# Patient Record
Sex: Male | Born: 1970 | Race: Black or African American | Hispanic: No | Marital: Married | Smoking: Never smoker
Health system: Southern US, Community
[De-identification: ages and names within clinical notes are randomized; demographics above are authoritative.]

## PROBLEM LIST (undated history)

## (undated) DIAGNOSIS — I10 Essential (primary) hypertension: Secondary | ICD-10-CM

## (undated) DIAGNOSIS — K219 Gastro-esophageal reflux disease without esophagitis: Secondary | ICD-10-CM

## (undated) HISTORY — DX: Gastro-esophageal reflux disease without esophagitis: K21.9

---

## 1998-02-15 ENCOUNTER — Other Ambulatory Visit: Admission: RE | Admit: 1998-02-15 | Discharge: 1998-02-15 | Payer: Self-pay | Admitting: Orthopaedic Surgery

## 1999-03-07 ENCOUNTER — Ambulatory Visit (HOSPITAL_COMMUNITY): Admission: RE | Admit: 1999-03-07 | Discharge: 1999-03-07 | Payer: Self-pay | Admitting: Internal Medicine

## 1999-03-07 ENCOUNTER — Encounter: Payer: Self-pay | Admitting: Internal Medicine

## 1999-08-04 ENCOUNTER — Encounter: Payer: Self-pay | Admitting: Orthopaedic Surgery

## 1999-08-04 ENCOUNTER — Encounter: Admission: RE | Admit: 1999-08-04 | Discharge: 1999-08-04 | Payer: Self-pay | Admitting: Orthopaedic Surgery

## 1999-08-25 ENCOUNTER — Encounter: Admission: RE | Admit: 1999-08-25 | Discharge: 1999-08-25 | Payer: Self-pay | Admitting: Orthopaedic Surgery

## 1999-08-25 ENCOUNTER — Encounter: Payer: Self-pay | Admitting: Orthopaedic Surgery

## 2004-07-22 ENCOUNTER — Ambulatory Visit: Payer: Self-pay | Admitting: Internal Medicine

## 2004-08-29 ENCOUNTER — Ambulatory Visit: Payer: Self-pay | Admitting: Internal Medicine

## 2004-11-04 ENCOUNTER — Ambulatory Visit: Payer: Self-pay | Admitting: Internal Medicine

## 2005-03-06 ENCOUNTER — Ambulatory Visit: Payer: Self-pay | Admitting: Internal Medicine

## 2005-03-27 ENCOUNTER — Ambulatory Visit: Payer: Self-pay | Admitting: Internal Medicine

## 2006-02-23 ENCOUNTER — Ambulatory Visit: Payer: Self-pay | Admitting: Internal Medicine

## 2006-03-09 ENCOUNTER — Ambulatory Visit: Payer: Self-pay | Admitting: Internal Medicine

## 2006-06-21 ENCOUNTER — Ambulatory Visit: Payer: Self-pay | Admitting: Family Medicine

## 2006-06-21 ENCOUNTER — Ambulatory Visit: Payer: Self-pay | Admitting: *Deleted

## 2006-09-21 ENCOUNTER — Ambulatory Visit: Payer: Self-pay | Admitting: Internal Medicine

## 2006-11-22 ENCOUNTER — Ambulatory Visit: Payer: Self-pay | Admitting: Internal Medicine

## 2006-11-26 ENCOUNTER — Ambulatory Visit: Payer: Self-pay | Admitting: Family Medicine

## 2007-01-03 ENCOUNTER — Ambulatory Visit: Payer: Self-pay | Admitting: Internal Medicine

## 2007-07-03 ENCOUNTER — Encounter (INDEPENDENT_AMBULATORY_CARE_PROVIDER_SITE_OTHER): Payer: Self-pay | Admitting: *Deleted

## 2007-09-27 ENCOUNTER — Ambulatory Visit: Payer: Self-pay | Admitting: Nurse Practitioner

## 2007-09-27 DIAGNOSIS — I10 Essential (primary) hypertension: Secondary | ICD-10-CM | POA: Insufficient documentation

## 2007-09-27 DIAGNOSIS — J309 Allergic rhinitis, unspecified: Secondary | ICD-10-CM

## 2007-09-27 DIAGNOSIS — K219 Gastro-esophageal reflux disease without esophagitis: Secondary | ICD-10-CM

## 2007-09-27 DIAGNOSIS — N400 Enlarged prostate without lower urinary tract symptoms: Secondary | ICD-10-CM | POA: Insufficient documentation

## 2007-09-27 HISTORY — DX: Gastro-esophageal reflux disease without esophagitis: K21.9

## 2007-09-27 HISTORY — DX: Allergic rhinitis, unspecified: J30.9

## 2007-09-27 LAB — CONVERTED CEMR LAB
ALT: 13 units/L (ref 0–53)
AST: 15 units/L (ref 0–37)
Albumin: 4.8 g/dL (ref 3.5–5.2)
Alkaline Phosphatase: 83 units/L (ref 39–117)
BUN: 10 mg/dL (ref 6–23)
Basophils Absolute: 0.1 10*3/uL (ref 0.0–0.1)
Basophils Relative: 1 % (ref 0–1)
Bilirubin Urine: NEGATIVE
Blood in Urine, dipstick: NEGATIVE
CO2: 26 meq/L (ref 19–32)
Calcium: 9.5 mg/dL (ref 8.4–10.5)
Chloride: 102 meq/L (ref 96–112)
Creatinine, Ser: 0.88 mg/dL (ref 0.40–1.50)
Eosinophils Absolute: 0.2 10*3/uL (ref 0.2–0.7)
Eosinophils Relative: 6 % — ABNORMAL HIGH (ref 0–5)
Glucose, Bld: 85 mg/dL (ref 70–99)
Glucose, Urine, Semiquant: NEGATIVE
HCT: 45 % (ref 39.0–52.0)
Hemoglobin: 15.5 g/dL (ref 13.0–17.0)
Ketones, urine, test strip: NEGATIVE
Lymphocytes Relative: 50 % — ABNORMAL HIGH (ref 12–46)
Lymphs Abs: 2 10*3/uL (ref 0.7–4.0)
MCHC: 34.4 g/dL (ref 30.0–36.0)
MCV: 84.3 fL (ref 78.0–100.0)
Monocytes Absolute: 0.4 10*3/uL (ref 0.1–1.0)
Monocytes Relative: 9 % (ref 3–12)
Neutro Abs: 1.4 10*3/uL — ABNORMAL LOW (ref 1.7–7.7)
Neutrophils Relative %: 34 % — ABNORMAL LOW (ref 43–77)
Nitrite: NEGATIVE
PSA: 0.73 ng/mL (ref 0.10–4.00)
Platelets: 366 10*3/uL (ref 150–400)
Potassium: 4.2 meq/L (ref 3.5–5.3)
Protein, U semiquant: NEGATIVE
RBC: 5.34 M/uL (ref 4.22–5.81)
RDW: 13.2 % (ref 11.5–15.5)
Sodium: 139 meq/L (ref 135–145)
Specific Gravity, Urine: 1.025
TSH: 1.366 microintl units/mL (ref 0.350–5.50)
Total Bilirubin: 0.5 mg/dL (ref 0.3–1.2)
Total Protein: 8.2 g/dL (ref 6.0–8.3)
Urobilinogen, UA: 0.2
WBC Urine, dipstick: NEGATIVE
WBC: 4 10*3/uL (ref 4.0–10.5)
pH: 5

## 2007-10-31 ENCOUNTER — Ambulatory Visit: Payer: Self-pay | Admitting: Nurse Practitioner

## 2007-10-31 DIAGNOSIS — K029 Dental caries, unspecified: Secondary | ICD-10-CM

## 2007-10-31 DIAGNOSIS — R059 Cough, unspecified: Secondary | ICD-10-CM | POA: Insufficient documentation

## 2007-10-31 DIAGNOSIS — R05 Cough: Secondary | ICD-10-CM

## 2007-10-31 HISTORY — DX: Dental caries, unspecified: K02.9

## 2008-01-03 ENCOUNTER — Telehealth (INDEPENDENT_AMBULATORY_CARE_PROVIDER_SITE_OTHER): Payer: Self-pay | Admitting: *Deleted

## 2008-01-30 ENCOUNTER — Ambulatory Visit: Payer: Self-pay | Admitting: Nurse Practitioner

## 2008-01-30 DIAGNOSIS — N398 Other specified disorders of urinary system: Secondary | ICD-10-CM | POA: Insufficient documentation

## 2008-01-30 DIAGNOSIS — R3919 Other difficulties with micturition: Secondary | ICD-10-CM

## 2008-07-07 ENCOUNTER — Ambulatory Visit: Payer: Self-pay | Admitting: Nurse Practitioner

## 2008-07-09 ENCOUNTER — Encounter (INDEPENDENT_AMBULATORY_CARE_PROVIDER_SITE_OTHER): Payer: Self-pay | Admitting: Nurse Practitioner

## 2008-07-09 LAB — CONVERTED CEMR LAB: Troponin I: 0.01 ng/mL (ref <0.06)

## 2008-07-14 ENCOUNTER — Telehealth (INDEPENDENT_AMBULATORY_CARE_PROVIDER_SITE_OTHER): Payer: Self-pay | Admitting: Nurse Practitioner

## 2008-08-10 ENCOUNTER — Ambulatory Visit: Payer: Self-pay | Admitting: Nurse Practitioner

## 2008-12-28 ENCOUNTER — Telehealth (INDEPENDENT_AMBULATORY_CARE_PROVIDER_SITE_OTHER): Payer: Self-pay | Admitting: *Deleted

## 2009-01-29 ENCOUNTER — Ambulatory Visit: Payer: Self-pay | Admitting: Nurse Practitioner

## 2009-01-29 DIAGNOSIS — H6690 Otitis media, unspecified, unspecified ear: Secondary | ICD-10-CM | POA: Insufficient documentation

## 2009-01-29 DIAGNOSIS — J029 Acute pharyngitis, unspecified: Secondary | ICD-10-CM | POA: Insufficient documentation

## 2009-02-01 ENCOUNTER — Encounter (INDEPENDENT_AMBULATORY_CARE_PROVIDER_SITE_OTHER): Payer: Self-pay | Admitting: Nurse Practitioner

## 2009-06-03 ENCOUNTER — Ambulatory Visit: Payer: Self-pay | Admitting: Physician Assistant

## 2009-06-03 DIAGNOSIS — N410 Acute prostatitis: Secondary | ICD-10-CM

## 2009-06-03 LAB — CONVERTED CEMR LAB
Basophils Absolute: 0.1 10*3/uL (ref 0.0–0.1)
Basophils Relative: 1 % (ref 0–1)
Bilirubin Urine: NEGATIVE
Blood in Urine, dipstick: NEGATIVE
Eosinophils Absolute: 0.3 10*3/uL (ref 0.0–0.7)
Eosinophils Relative: 3 % (ref 0–5)
Glucose, Urine, Semiquant: NEGATIVE
HCT: 41.6 % (ref 39.0–52.0)
Hemoglobin: 13.9 g/dL (ref 13.0–17.0)
Ketones, urine, test strip: NEGATIVE
Lymphocytes Relative: 21 % (ref 12–46)
Lymphs Abs: 2.2 10*3/uL (ref 0.7–4.0)
MCHC: 33.4 g/dL (ref 30.0–36.0)
MCV: 86.8 fL (ref 78.0–100.0)
Monocytes Absolute: 1.3 10*3/uL — ABNORMAL HIGH (ref 0.1–1.0)
Monocytes Relative: 13 % — ABNORMAL HIGH (ref 3–12)
Neutro Abs: 6.5 10*3/uL (ref 1.7–7.7)
Neutrophils Relative %: 63 % (ref 43–77)
Nitrite: NEGATIVE
Platelets: 311 10*3/uL (ref 150–400)
Protein, U semiquant: 100
RBC: 4.79 M/uL (ref 4.22–5.81)
RDW: 13.3 % (ref 11.5–15.5)
Specific Gravity, Urine: >=1.03
Urobilinogen, UA: 0.2
WBC Urine, dipstick: NEGATIVE
WBC: 10.3 10*3/uL (ref 4.0–10.5)
pH: 5.5

## 2009-06-04 ENCOUNTER — Encounter: Payer: Self-pay | Admitting: Physician Assistant

## 2009-06-17 ENCOUNTER — Ambulatory Visit: Payer: Self-pay | Admitting: Nurse Practitioner

## 2009-06-24 ENCOUNTER — Ambulatory Visit: Payer: Self-pay | Admitting: Nurse Practitioner

## 2009-09-02 ENCOUNTER — Telehealth (INDEPENDENT_AMBULATORY_CARE_PROVIDER_SITE_OTHER): Payer: Self-pay | Admitting: Nurse Practitioner

## 2009-10-22 ENCOUNTER — Telehealth (INDEPENDENT_AMBULATORY_CARE_PROVIDER_SITE_OTHER): Payer: Self-pay | Admitting: Nurse Practitioner

## 2009-11-10 ENCOUNTER — Telehealth (INDEPENDENT_AMBULATORY_CARE_PROVIDER_SITE_OTHER): Payer: Self-pay | Admitting: Nurse Practitioner

## 2009-11-24 ENCOUNTER — Telehealth (INDEPENDENT_AMBULATORY_CARE_PROVIDER_SITE_OTHER): Payer: Self-pay | Admitting: Internal Medicine

## 2009-11-26 ENCOUNTER — Ambulatory Visit: Payer: Self-pay | Admitting: Internal Medicine

## 2009-12-16 ENCOUNTER — Encounter (INDEPENDENT_AMBULATORY_CARE_PROVIDER_SITE_OTHER): Payer: Self-pay | Admitting: Nurse Practitioner

## 2009-12-29 ENCOUNTER — Ambulatory Visit: Payer: Self-pay | Admitting: Nurse Practitioner

## 2009-12-29 LAB — CONVERTED CEMR LAB: PSA: 0.59 ng/mL (ref 0.10–4.00)

## 2010-01-27 ENCOUNTER — Ambulatory Visit: Payer: Self-pay | Admitting: Nurse Practitioner

## 2010-11-13 LAB — CONVERTED CEMR LAB
PSA: 0.72 ng/mL (ref 0.10–4.00)
Rapid Strep: NEGATIVE

## 2010-11-15 NOTE — Progress Notes (Signed)
Summary: Santura refill   Phone Note Call from Patient Call back at [facility] Phone [phone]   Summary of Call: The pt would like to get more samples from sanctura xr 60 mg.  Pt states that the provider told him to stop by when he needs samples.  [facility]). [facility] FNP Initial call taken by: Manon Hilding,  October 22, 2009 8:15 AM  Follow-up for Phone Call        no samples available will send rx to the pharmacy Follow-up by: Lehman Prom FNP,  October 22, 2009 8:37 AM    Prescriptions: SANCTURA XR 60 MG XR24H-CAP (TROSPIUM CHLORIDE) One tablet by mouth daily for bladder  #30 x 5   Entered and Authorized by:   Lehman Prom FNP   Signed by:   Lehman Prom FNP on 10/22/2009   Method used:   Faxed to ...       [facility] (retail)       [address] [place], [place]  [address]       Ph: [phone]       Fax: [phone]   RxID:   [id number]

## 2010-11-15 NOTE — Assessment & Plan Note (Signed)
Summary: Urinary problems   Vital Signs:  Patient profile:   40 year old male Weight:      186.9 pounds BMI:     28.11 BSA:     2.00 Temp:     97.9 degrees F oral Pulse rate:   78 / minute Pulse rhythm:   regular Resp:     20 per minute BP sitting:   134 / 87  (left arm) Cuff size:   large  Vitals Entered By: Levon Hedger (December 29, 2009 3:11 PM) CC: prostate problem Is Patient Diabetic? No Pain Assessment Patient in pain? yes     Location: penis Intensity: 5  Does patient need assistance? Functional Status Self care Ambulation Normal   Primary Care Provider:  Daphine Deutscher  CC:  prostate problem.  History of Present Illness:  Pt into the office again with complaints of "Prostate" Pt feels like his symptoms have worsened.  Burning with urination. Reports some testicular swelling since his last visit (seen by Dr. Delrae Alfred on 2-08/2010)  Pt was prescribed cipro (presents today with 11 pills left, he did not complete the course) Pt referred himself to [facility] - report reviewed today in this office. Dx with prostatitis. Pt was started on doxycycline 100mg  by mouth two times a day (he has medication present today).  Specialist did not feel that prostate was large enough for finasteride and it would not relieve the symptoms, so pt stopped the medication.  Prostate exam was done during the visit. Suggested that pt may have interstitial cystitis and may need additional testing such as cystoscopy, HOD, and bladder biopsy to look for interstitial cystitis.       Medications Prior to Update: 1)  Allegra 180 Mg Tabs (Fexofenadine Hcl) .Marland Kitchen.. 1 Tablet By Mouth Daily For Allergies 2)  Lisinopril 5 Mg  Tabs (Lisinopril) .Marland Kitchen.. 1 Tablet By Mouth Daily For Blood Pressure 3)  Nexium 40 Mg Cpdr (Esomeprazole Magnesium) .Marland Kitchen.. 1 Tablet By Mouth Daily For Stomach 4)  Ibuprofen 800 Mg Tabs (Ibuprofen) .Marland Kitchen.. 1 Tablet By Mouth Two Times A Day As Needed For Pain 5)  Multivitamins  Caps  (Multiple Vitamin) .... One Tablet By Mouth Daily 6)  Nasacort Aq 55 Mcg/act Aers (Triamcinolone Acetonide(Nasal)) .Marland Kitchen.. 1 Spray in Each Nostril Two Times A Day 7)  Flomax 0.4 Mg Caps (Tamsulosin Hcl) .... 2 Caps By Mouth Daily At Dinner or Bedtime 8)  Proscar 5 Mg Tabs (Finasteride) .Marland Kitchen.. 1 Tab By Mouth Daily 9)  Ciprofloxacin Hcl 500 Mg Tabs (Ciprofloxacin Hcl) .Marland Kitchen.. 1 Tab By Mouth Two Times A Day For 14 Days  Allergies (verified): No Known Drug Allergies  Review of Systems CV:  Denies chest pain or discomfort. Resp:  Denies cough. GI:  Denies abdominal pain, nausea, and vomiting. GU:  Complains of dysuria.  Physical Exam  General:  alert.   Head:  normocephalic.   Prostate:  done by urology Msk:  normal ROM.   Neurologic:  alert & oriented X3.   Skin:  color normal.   Psych:  Oriented X3.     Impression & Recommendations:  Problem # 1:  OTHER URINARY PROBLEMS (ICD-V47.4) will check psa pt can re-start sanctura he may need bladder studies as suggested by Urology if symtpoms continue  Problem # 2:  BENIGN PROSTATIC HYPERTROPHY, HX OF (ICD-V13.8) note reviewed by urology will check psa Orders: T-PSA ([id number])  Complete Medication List: 1)  Allegra 180 Mg Tabs (Fexofenadine hcl) .Marland Kitchen.. 1 tablet by mouth daily for allergies 2)  Lisinopril 5 Mg Tabs (Lisinopril) .Marland Kitchen.. 1 tablet by mouth daily for blood pressure 3)  Nexium 40 Mg Cpdr (Esomeprazole magnesium) .Marland Kitchen.. 1 tablet by mouth daily for stomach 4)  Ibuprofen 800 Mg Tabs (Ibuprofen) .Marland Kitchen.. 1 tablet by mouth two times a day as needed for pain 5)  Multivitamins Caps (Multiple vitamin) .... One tablet by mouth daily 6)  Nasacort Aq 55 Mcg/act Aers (Triamcinolone acetonide(nasal)) .Marland Kitchen.. 1 spray in each nostril two times a day 7)  Flomax 0.4 Mg Caps (Tamsulosin hcl) .... 2 caps by mouth daily at dinner or bedtime 8)  Doxycycline Hyclate 100 Mg Tabs (Doxycycline hyclate) .... One tablet by mouth two times a day 9)  Sanctura Xr 60  Mg Xr24h-cap (Trospium chloride) .... One tablet by mouth daily  Patient Instructions: 1)  If the sanctura does not work you may need additional testing as suggested by urology 2)  Your problem may be interstitial cystitis (irritation of the bladder). 3)  You may need additoinal testing by the specialist such as cystoscopy. 4)  Your prostate lab will be checked today Prescriptions: SANCTURA XR 60 MG XR24H-CAP (TROSPIUM CHLORIDE) One tablet by mouth daily  #30 x 5   Entered and Authorized by:   Lehman Prom FNP   Signed by:   Lehman Prom FNP on 12/29/2009   Method used:   Faxed to ...       [facility] (retail)       [address] [place], [place]  [address]       Ph: [phone]       Fax: [phone]   RxID:   [id number]

## 2010-11-15 NOTE — Assessment & Plan Note (Signed)
Summary: PROSTATE ISSUES/GK   Vital Signs:  Patient profile:   40 year old male Weight:      178 pounds Temp:     98.0 degrees F Pulse rate:   75 / minute Pulse rhythm:   regular Resp:     18 per minute BP sitting:   131 / 77  (left arm) Cuff size:   large  Vitals Entered By: Vesta Mixer CMA (November 26, 2009 9:28 AM) CC: having some painful and burning with urination x one week is also going more frequently feels like not able to empty bladder.  Is also feeling achy also Is Patient Diabetic? No Pain Assessment Patient in pain? yes     Location: prostate Intensity: 4  Does patient need assistance? Ambulation Normal   Primary Care Provider:  Daphine Deutscher  CC:  having some painful and burning with urination x one week is also going more frequently feels like not able to empty bladder.  Is also feeling achy also.  History of Present Illness: 1.   Urinary complaints:  has had problems since at least 11/2006.  Was noted to have a boggy prostate and sense of incomplete emptying and dribbling after urination.  Started on [facility] for one month without sense of improvement.  Was seen 12/2006 and felt to have probable prostatitis and treated as such.  Seen later in 09/2007 with similar complaints--started on Proscar, which he took for about 4 months and because did not feel he was significantly improved, was switched to Flomax.  Not clear how long he took the Flomax, but looks like may have been for 1 year.  Admits to not taking regularly unless he  noted worsening symptoms.  Was ultimately switched to Oxybutynin  with concern this may be more of a bladder problem and then [place].  Currently getting Sanctura [facility] and no samples to use until comes in in 6 weeks--was switched back to Oxybutnin about 2 weeks ago, but went about 2 weeks without any medication.  During that time period, developed a lot of burning in perineum and lower pelvic area.  Feels feverish at time.  Lot of dysuria with  decreased flow and not able to empty.  Started on Oxybutynin to tide him over until [place] comes in on 2/3.  Feels he has worsened with dry mouth and urinary symptoms worse--stopped on 2/8.  Pt. denies penile discharge, he did have testicular pain for 3 days this week--originally blamed on starting Flomax that he had left over--took for 3 days also, but started the med after the testicular pain started.   Allergies (verified): No Known Drug Allergies  Physical Exam  Abdomen:  Mild suprapubic tenderness. Rectal:  prostate enlarged and a bit boggy.  Quite tender, though tender even with just insertion of gloved digit into anus--pt. denies anal pain with passing feces.  Also tender on palpation of prostate, but does not seem to increase from that above.  Heme negative light brown stool   Impression & Recommendations:  Problem # 1:  ACUTE PROSTATITIS (ICD-601.0) Cipro 500 mg two times a day for 14 days Push water  Problem # 2:  BENIGN PROSTATIC HYPERTROPHY, HX OF (ICD-V13.8) Will go back to Flomax--high dose--pt. to take every day, not as needed  Restart Proscar as well--should take for 6 months to see if beneficial This was an exceedingly long visit as had difficulty getting pt. to focus on questions asked He admitted to not taking meds appropriately in past Side effects with Reunion  and especially Oxybutynin  Complete Medication List: 1)  Allegra 180 Mg Tabs (Fexofenadine hcl) .Marland Kitchen.. 1 tablet by mouth daily for allergies 2)  Lisinopril 5 Mg Tabs (Lisinopril) .Marland Kitchen.. 1 tablet by mouth daily for blood pressure 3)  Nexium 40 Mg Cpdr (Esomeprazole magnesium) .Marland Kitchen.. 1 tablet by mouth daily for stomach 4)  Ibuprofen 800 Mg Tabs (Ibuprofen) .Marland Kitchen.. 1 tablet by mouth two times a day as needed for pain 5)  Multivitamins Caps (Multiple vitamin) .... One tablet by mouth daily 6)  Nasacort Aq 55 Mcg/act Aers (Triamcinolone acetonide(nasal)) .Marland Kitchen.. 1 spray in each nostril two times a day 7)  Flomax 0.4 Mg  Caps (Tamsulosin hcl) .... 2 caps by mouth daily at dinner or bedtime 8)  Proscar 5 Mg Tabs (Finasteride) .Marland Kitchen.. 1 tab by mouth daily 9)  Ciprofloxacin Hcl 500 Mg Tabs (Ciprofloxacin hcl) .Marland Kitchen.. 1 tab by mouth two times a day for 14 days  Patient Instructions: 1)  Follow up with Jesse Fall, FNP  2-3 months Prescriptions: CIPROFLOXACIN HCL 500 MG TABS (CIPROFLOXACIN HCL) 1 tab by mouth two times a day for 14 days  #28 x 0   Entered and Authorized by:   Julieanne Manson MD   Signed by:   Julieanne Manson MD on 11/26/2009   Method used:   Electronically to        [facility] [phone]* (retail)       [address]       [place], [place]  [address]       Ph: [phone]       Fax: [phone]   RxID:   [id number] PROSCAR 5 MG TABS (FINASTERIDE) 1 tab by mouth daily  #30 x 11   Entered and Authorized by:   Julieanne Manson MD   Signed by:   Julieanne Manson MD on 11/26/2009   Method used:   Faxed to ...       [facility] (retail)       [address] [place], [place]  [address]       Ph: [phone]       Fax: [phone]   RxID:   [id number] FLOMAX 0.4 MG CAPS (TAMSULOSIN HCL) 2 caps by mouth daily at dinner or bedtime  #60 x 11   Entered and Authorized by:   Julieanne Manson MD   Signed by:   Julieanne Manson MD on 11/26/2009   Method used:   Faxed to ...       [facility] (retail)       [address] [place], [place]  [address]       Ph: [phone]       Fax: [phone]   RxID:   [id number]   Appended Document: PROSTATE ISSUES/GK  Laboratory Results   Urine Tests    Routine Urinalysis   Glucose: negative   (Normal Range: Negative) Bilirubin: negative   (Normal Range: Negative) Ketone: negative   (Normal Range: Negative) Spec. Gravity: 1.010   (Normal Range: 1.003-1.035) Blood: negative   (Normal Range: Negative) pH: 6.5   (Normal Range:  5.0-8.0) Protein: negative   (Normal Range: Negative) Urobilinogen: 0.2   (Normal Range: 0-1) Nitrite: negative   (Normal Range: Negative) Leukocyte Esterace: negative   (Normal Range: Negative)

## 2010-11-15 NOTE — Assessment & Plan Note (Signed)
Summary: Urinary Problems   Vital Signs:  Patient profile:   40 year old male Height:      68.5 inches Weight:      184.9 pounds BMI:     27.81 Temp:     98.0 degrees F oral Pulse rate:   82 / minute Pulse rhythm:   regular Resp:     20 per minute BP sitting:   136 / 92  (left arm) Cuff size:   large  Vitals Entered By: Armenia Shannon (January 27, 2010 9:45 AM)  Nutrition Counseling: Patient's BMI is greater than 25 and therefore counseled on weight management options. CC: f/u.., Hypertension Management Is Patient Diabetic? No Pain Assessment Patient in pain? no       Does patient need assistance? Functional Status Self care Ambulation Normal   Primary Care Provider:  Daphine Deutscher  CC:  f/u.Marland Kitchen and Hypertension Management.  History of Present Illness:  Pt into the office for follow up for continued c/o urinary problems (See previous visits for full history) Pt self referred himself to urology and was seen there prior to his last visit here. Pt needs to f/u there but is not able to afford $250 per office visit to be seen. Dr. Vonita Moss called this pt and left a message indicating that he would like for the pt to start amitriptyline to help relax his bladder.  Pt does not seem comfortable with taking the medication without having testing done to see what the problem is. Pt has been taking sanctura since his last visit here and reports that he has seen some good results but still with some urinary frequency and dysuria.  Obesity - down 2 pounds since his last visit.  Advises pt that he should strive to lose at least 10 more pounds     Hypertension History:      He denies headache, chest pain, and palpitations.  Pt has not been taking his blood pressure medication.        Positive major cardiovascular risk factors include hypertension.  Negative major cardiovascular risk factors include male age less than 63 years old, negative family history for ischemic heart disease, and  non-tobacco-user status.        Further assessment for target organ damage reveals no history of cardiac end-organ damage (CHF/LVH), stroke/TIA, peripheral vascular disease, renal insufficiency, or hypertensive retinopathy.     Allergies (verified): No Known Drug Allergies  Comments:  Nurse/Medical Assistant: pt says he only takes the sanctura Armenia Shannon (January 27, 2010 9:48 AM)  Review of Systems CV:  Denies chest pain or discomfort. Resp:  Denies cough. GI:  Denies abdominal pain, nausea, and vomiting. GU:  Complains of dysuria and urinary frequency; denies hematuria.  Physical Exam  General:  alert.   Head:  normocephalic.   Msk:  normal ROM.   Neurologic:  alert & oriented X3.   Skin:  color normal.   Psych:  Oriented X3.     Impression & Recommendations:  Problem # 1:  HYPERTENSION, BENIGN (ICD-401.1) BP is still elevated. DASH diet advised pt that he should take lisinopril His updated medication list for this problem includes:    Lisinopril 5 Mg Tabs (Lisinopril) .Marland Kitchen... 1 tablet by mouth daily for blood pressure  Problem # 2:  OTHER URINARY PROBLEMS (ICD-V47.4) advised pt that he will need to f/u with urology as per their instructions  Complete Medication List: 1)  Allegra 180 Mg Tabs (Fexofenadine hcl) .Marland Kitchen.. 1 tablet by mouth daily for allergies  2)  Lisinopril 5 Mg Tabs (Lisinopril) .Marland Kitchen.. 1 tablet by mouth daily for blood pressure 3)  Nexium 40 Mg Cpdr (Esomeprazole magnesium) .Marland Kitchen.. 1 tablet by mouth daily for stomach 4)  Ibuprofen 800 Mg Tabs (Ibuprofen) .Marland Kitchen.. 1 tablet by mouth two times a day as needed for pain 5)  Multivitamins Caps (Multiple vitamin) .... One tablet by mouth daily 6)  Nasacort Aq 55 Mcg/act Aers (Triamcinolone acetonide(nasal)) .Marland Kitchen.. 1 spray in each nostril two times a day 7)  Sanctura Xr 60 Mg Xr24h-cap (Trospium chloride) .... One tablet by mouth daily  Hypertension Assessment/Plan:      The patient's hypertensive risk group is category A:  No risk factors and no target organ damage.  Today's blood pressure is 136/92.  His blood pressure goal is < 140/90.  Patient Instructions: 1)  You will need to contact urology and you may need additional testing to determine what the problem is with your bladder 2)  High Blood pressure - you will need to restart the lisinopril 5mg  by mouth daily 3)  Follow up as needed

## 2010-11-15 NOTE — Letter (Signed)
[facility]: [facility]   Imported By: Arta Bruce 03/03/2010 15:34:24  _____________________________________________________________________  External Attachment:    Type:   Image     Comment:   External Document

## 2010-11-15 NOTE — Progress Notes (Signed)
Summary: Medication   Medications Added OXYBUTYNIN CHLORIDE 5 MG TABS (OXYBUTYNIN CHLORIDE) Two tablets by mouth two times a day  **Pharmacy - Dispense until Philis Nettle comes from [facility]**       Phone Note Call from Patient Call back at [facility] Phone [phone]   Summary of Call: The pt wants to inform the provider that oxybutynin 5 mg is not working and his sanctura will take an amount of 4-6 weeks so as result he wants to get another medication until he gets the sanctura.  Also, the pt wants to make sure if flomax can help him with the prostate problem if so he needs to increase the dose above 4 mg.   Please call him back in case there is any questions. [facility] FNP Initial call taken by: Manon Hilding,  November 24, 2009 10:53 AM  Follow-up for Phone Call        spoke with pt he says since yesterday it is for for him to urinate.  He says he is taking Oxybutin he says  it is working but it is not working and he is having alot of burning and pain on the left side.  he says he is urinating alot. He wants to know if he can get Flomax  Follow-up by: Levon Hedger,  November 24, 2009 11:45 AM  Additional Follow-up for Phone Call Additional follow up Details #1::        advise pt to increase oxybutin to 2 tablets by mouth two times a day and see if he has any better effect I know he will complete his supply early but at that time i can either write him a new Rx or his sanctura may be in by then Additional Follow-up by: Lehman Prom FNP,  November 24, 2009 12:33 PM    Additional Follow-up for Phone Call Additional follow up Details #2::    Levon Hedger  November 25, 2009 12:49 PM called [phone] no answer.  Additional Follow-up for Phone Call Additional follow up Details #3:: Details for Additional Follow-up Action Taken: pt. here to be seen today Additional Follow-up by: Julieanne Manson MD,  November 26, 2009 10:56 AM  New/Updated Medications: OXYBUTYNIN CHLORIDE 5 MG TABS  (OXYBUTYNIN CHLORIDE) Two tablets by mouth two times a day  **Pharmacy - Dispense until [place] comes from [facility]**

## 2010-11-15 NOTE — Progress Notes (Signed)
Summary: Sanctura XR  Medications Added OXYBUTYNIN CHLORIDE 5 MG TABS (OXYBUTYNIN CHLORIDE) One tablet by mouth two times a day  **Pharmacy - Dispense until Philis Nettle comes from [facility]**       Phone Note Call from Patient Call back at Home Phone [phone]   Summary of Call: [facility] do not carry any longer the "sanctura medication XR" and as result he needs to stop by to filled out a form and the medication will take an amount of 6 to 8 weeks to process.  Pt wants to make sure if the provider can either gave him some samples or another supplement.  Pt needs the medication as soon as possible because he is feeling sick right now; pt states that when he pee the bottom section  of his testicles starts burning very bad. [facility] [address] [phone])  (pt is at the lobby at this moment for a short time) [facility] FNP Initial call taken by: Manon Hilding,  November 10, 2009 8:14 AM  Follow-up for Phone Call        forward to N. Martin,FNP Follow-up by: Levon Hedger,  November 12, 2009 3:34 PM  Additional Follow-up for Phone Call Additional follow up Details #1::        There are not samples available in the office The only option will be for him to take the meds previous ordered before Sanctura.  He reports that those meds were not effective but am willing to restart until his other meds come in. Additional Follow-up by: Lehman Prom FNP,  November 12, 2009 4:21 PM    Additional Follow-up for Phone Call Additional follow up Details #2::    Spoke with pt and he is wanting to get the Rx sent to the [facility] pharmacy. Follow-up by: Levon Hedger,  November 15, 2009 9:03 AM  Additional Follow-up for Phone Call Additional follow up Details #3:: Details for Additional Follow-up Action Taken: Rx for oxybutynin 5mg  by mouth two times a day sent to [facility] pharmacy. He will use this until Philis Nettle is available from the [facility].  advise pt to check there later this week for  oxybutynin. n.martin,fnp  November 15, 2009 9:10 AM  Levon Hedger  November 15, 2009 9:13 AM Pt informed.    New/Updated Medications: OXYBUTYNIN CHLORIDE 5 MG TABS (OXYBUTYNIN CHLORIDE) One tablet by mouth two times a day  **Pharmacy - Dispense until [place] comes from [facility]** Prescriptions: OXYBUTYNIN CHLORIDE 5 MG TABS (OXYBUTYNIN CHLORIDE) One tablet by mouth two times a day  **Pharmacy - Dispense until [place] comes from [facility]**  #60 x 1   Entered and Authorized by:   Lehman Prom FNP   Signed by:   Lehman Prom FNP on 11/15/2009   Method used:   Faxed to ...       [facility] (retail)       [address] [place], [place]  [address]       Ph: [phone]       Fax: [phone]   RxID:   [id number]

## 2011-03-19 ENCOUNTER — Emergency Department (HOSPITAL_COMMUNITY)
Admission: EM | Admit: 2011-03-19 | Discharge: 2011-03-19 | Disposition: A | Discharge status: Home / Self Care | Payer: Self-pay | Attending: Emergency Medicine | Admitting: Emergency Medicine

## 2011-03-19 ENCOUNTER — Emergency Department (HOSPITAL_COMMUNITY): Payer: Self-pay

## 2011-03-19 DIAGNOSIS — R61 Generalized hyperhidrosis: Secondary | ICD-10-CM | POA: Insufficient documentation

## 2011-03-19 DIAGNOSIS — Z79899 Other long term (current) drug therapy: Secondary | ICD-10-CM | POA: Insufficient documentation

## 2011-03-19 DIAGNOSIS — J40 Bronchitis, not specified as acute or chronic: Secondary | ICD-10-CM | POA: Insufficient documentation

## 2011-03-19 DIAGNOSIS — R059 Cough, unspecified: Secondary | ICD-10-CM | POA: Insufficient documentation

## 2011-03-19 DIAGNOSIS — R05 Cough: Secondary | ICD-10-CM | POA: Insufficient documentation

## 2011-03-19 DIAGNOSIS — K219 Gastro-esophageal reflux disease without esophagitis: Secondary | ICD-10-CM | POA: Insufficient documentation

## 2011-03-19 DIAGNOSIS — I10 Essential (primary) hypertension: Secondary | ICD-10-CM | POA: Insufficient documentation

## 2011-03-29 ENCOUNTER — Emergency Department (HOSPITAL_COMMUNITY)
Admission: EM | Admit: 2011-03-29 | Discharge: 2011-03-29 | Disposition: A | Discharge status: Home / Self Care | Payer: Self-pay | Attending: Emergency Medicine | Admitting: Emergency Medicine

## 2011-03-29 DIAGNOSIS — R05 Cough: Secondary | ICD-10-CM | POA: Insufficient documentation

## 2011-03-29 DIAGNOSIS — I10 Essential (primary) hypertension: Secondary | ICD-10-CM | POA: Insufficient documentation

## 2011-03-29 DIAGNOSIS — Z79899 Other long term (current) drug therapy: Secondary | ICD-10-CM | POA: Insufficient documentation

## 2011-03-29 DIAGNOSIS — R059 Cough, unspecified: Secondary | ICD-10-CM | POA: Insufficient documentation

## 2011-03-29 DIAGNOSIS — K219 Gastro-esophageal reflux disease without esophagitis: Secondary | ICD-10-CM | POA: Insufficient documentation

## 2011-08-02 ENCOUNTER — Inpatient Hospital Stay (INDEPENDENT_AMBULATORY_CARE_PROVIDER_SITE_OTHER)
Admission: RE | Admit: 2011-08-02 | Discharge: 2011-08-02 | Disposition: A | Discharge status: Home / Self Care | Payer: Self-pay | Source: Ambulatory Visit | Attending: Emergency Medicine | Admitting: Emergency Medicine

## 2011-08-02 DIAGNOSIS — M799 Soft tissue disorder, unspecified: Secondary | ICD-10-CM

## 2011-08-02 LAB — POCT URINALYSIS DIP (DEVICE)
Glucose, UA: NEGATIVE mg/dL
Hgb urine dipstick: NEGATIVE
Specific Gravity, Urine: 1.025 (ref 1.005–1.030)

## 2011-08-16 ENCOUNTER — Inpatient Hospital Stay (INDEPENDENT_AMBULATORY_CARE_PROVIDER_SITE_OTHER)
Admission: RE | Admit: 2011-08-16 | Discharge: 2011-08-16 | Disposition: A | Discharge status: Home / Self Care | Payer: Self-pay | Source: Ambulatory Visit | Attending: Family Medicine | Admitting: Family Medicine

## 2011-08-16 DIAGNOSIS — M549 Dorsalgia, unspecified: Secondary | ICD-10-CM

## 2011-08-16 DIAGNOSIS — H109 Unspecified conjunctivitis: Secondary | ICD-10-CM

## 2011-08-16 LAB — POCT I-STAT, CHEM 8
BUN: 12 mg/dL (ref 6–23)
Chloride: 104 mEq/L (ref 96–112)
Creatinine, Ser: 0.9 mg/dL (ref 0.50–1.35)
Glucose, Bld: 112 mg/dL — ABNORMAL HIGH (ref 70–99)
HCT: 47 % (ref 39.0–52.0)
Potassium: 3.9 mEq/L (ref 3.5–5.1)

## 2011-08-16 LAB — POCT URINALYSIS DIP (DEVICE)
Bilirubin Urine: NEGATIVE
Glucose, UA: NEGATIVE mg/dL
Hgb urine dipstick: NEGATIVE
Ketones, ur: NEGATIVE mg/dL
Specific Gravity, Urine: <=1.005 (ref 1.005–1.030)
pH: 5 (ref 5.0–8.0)

## 2011-11-08 ENCOUNTER — Other Ambulatory Visit: Payer: Self-pay | Admitting: Internal Medicine

## 2011-11-08 DIAGNOSIS — N50812 Left testicular pain: Secondary | ICD-10-CM

## 2011-11-08 DIAGNOSIS — R109 Unspecified abdominal pain: Secondary | ICD-10-CM

## 2011-11-10 ENCOUNTER — Ambulatory Visit (HOSPITAL_COMMUNITY)
Admission: RE | Admit: 2011-11-10 | Discharge: 2011-11-10 | Disposition: A | Discharge status: Home / Self Care | Payer: Medicaid Other | Source: Ambulatory Visit | Attending: Internal Medicine | Admitting: Internal Medicine

## 2011-11-10 DIAGNOSIS — R109 Unspecified abdominal pain: Secondary | ICD-10-CM

## 2011-11-10 DIAGNOSIS — N50812 Left testicular pain: Secondary | ICD-10-CM

## 2011-11-10 DIAGNOSIS — I700 Atherosclerosis of aorta: Secondary | ICD-10-CM | POA: Insufficient documentation

## 2011-11-10 DIAGNOSIS — N433 Hydrocele, unspecified: Secondary | ICD-10-CM | POA: Insufficient documentation

## 2012-04-05 ENCOUNTER — Encounter (HOSPITAL_COMMUNITY): Payer: Self-pay | Admitting: Emergency Medicine

## 2012-04-05 ENCOUNTER — Emergency Department (HOSPITAL_COMMUNITY): Payer: Medicaid Other

## 2012-04-05 ENCOUNTER — Emergency Department (HOSPITAL_COMMUNITY)
Admission: EM | Admit: 2012-04-05 | Discharge: 2012-04-05 | Disposition: A | Discharge status: Home / Self Care | Payer: Medicaid Other | Attending: Emergency Medicine | Admitting: Emergency Medicine

## 2012-04-05 DIAGNOSIS — N419 Inflammatory disease of prostate, unspecified: Secondary | ICD-10-CM | POA: Insufficient documentation

## 2012-04-05 DIAGNOSIS — N433 Hydrocele, unspecified: Secondary | ICD-10-CM | POA: Insufficient documentation

## 2012-04-05 LAB — URINALYSIS, ROUTINE W REFLEX MICROSCOPIC
Hgb urine dipstick: NEGATIVE
Leukocytes, UA: NEGATIVE
Nitrite: NEGATIVE
Specific Gravity, Urine: 1.022 (ref 1.005–1.030)
Urobilinogen, UA: 1 mg/dL (ref 0.0–1.0)

## 2012-04-05 MED ORDER — CIPROFLOXACIN HCL 500 MG PO TABS: 500.0000 mg | ORAL_TABLET | Freq: Two times a day (BID) | ORAL | Status: AC

## 2012-04-05 MED ORDER — HYDROCODONE-ACETAMINOPHEN 5-500 MG PO TABS: 1.0000 | ORAL_TABLET | Freq: Four times a day (QID) | ORAL | Status: AC | PRN

## 2012-04-05 MED ORDER — OXYCODONE-ACETAMINOPHEN 5-325 MG PO TABS
1.0000 | ORAL_TABLET | Freq: Once | ORAL | Status: AC
  Administered 2012-04-05: 1 via ORAL
  Filled 2012-04-05: qty 1

## 2012-04-05 NOTE — ED Notes (Signed)
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 Reports this pain in his testicles has lasted for the past 2 weeks. With painful urination. Repots alternating swelling in both testicles.

## 2012-04-05 NOTE — ED Notes (Signed)
Pt. To ultrasound.

## 2012-04-05 NOTE — ED Provider Notes (Signed)
Medical screening examination/treatment/procedure(s) were performed by non-physician practitioner and as supervising physician I was immediately available for consultation/collaboration.   Gavin Pound. Oletta Lamas, MD 04/05/12 2107

## 2012-04-05 NOTE — ED Notes (Signed)
Patient states that he has had prostate issues in the past but in the past couple  His testicles have been swelling, a burning sensations inside his testicles and pain in his testicles as well.

## 2012-04-05 NOTE — ED Provider Notes (Signed)
History     CSN: [id number]  Arrival date & time 04/05/12  1041   First MD Initiated Contact with Patient 04/05/12 1058      Chief Complaint  Patient presents with  . Testicle Pain    (Consider location/radiation/quality/duration/timing/severity/associated sxs/prior treatment) Patient is a 41 y.o. male presenting with testicular pain. The history is provided by the patient.  Testicle Pain This is a recurrent problem. The current episode started in the past 7 days. The problem occurs constantly. The problem has been gradually worsening. Pertinent negatives include no abdominal pain, chills, fever, nausea, numbness or weakness.  Pt reports intermittent testicular swelling, pain and burning with urination, pain over bilateral testes. States has seen urologist in the past for the same. Was told he had enlarged prostate. Was treated with antibiotics and ibuprofen. States antibiotic course usually makes his pain better, ibuprofen does not help. Pt states this time, symptoms started few days ago, today worsened. Pt denies fever, chills, malaise. Denies hematuria. No abdominal or back pain.   No past medical history on file.  No past surgical history on file.  No family history on file.  History  Substance Use Topics  . Smoking status: Not on file  . Smokeless tobacco: Not on file  . Alcohol Use: Not on file      Review of Systems  Constitutional: Negative for fever and chills.  Respiratory: Negative.   Cardiovascular: Negative.   Gastrointestinal: Negative for nausea and abdominal pain.  Genitourinary: Positive for dysuria, urgency, scrotal swelling and testicular pain. Negative for frequency, flank pain, discharge and penile pain.  Musculoskeletal: Negative.  Negative for gait problem.  Skin: Negative.   Neurological: Negative for weakness and numbness.    Allergies  Review of patient's allergies indicates no known allergies.  Home Medications   Current Outpatient Rx    Name Route Sig Dispense Refill  . ISOSORBIDE DINITRATE 5 MG PO TABS Oral Take 5 mg by mouth every evening.      BP 136/77  Pulse 81  Temp 98.1 F (36.7 C)  Resp 18  SpO2 98%  Physical Exam  Nursing note and vitals reviewed. Constitutional: He is oriented to person, place, and time. He appears well-developed and well-nourished. No distress.  HENT:  Head: Normocephalic.  Neck: Neck supple.  Cardiovascular: Normal rate, regular rhythm and normal heart sounds.   Pulmonary/Chest: Effort normal and breath sounds normal. No respiratory distress. He has no wheezes. He has no rales.  Abdominal: Soft. Bowel sounds are normal. He exhibits no distension. There is no tenderness.  Genitourinary: Penis normal. No penile tenderness.       Normal testes bilaterally. Mild tenderness over bilateral testes with palpation. Tender over prostate  Neurological: He is alert and oriented to person, place, and time.  Skin: Skin is warm and dry.  Psychiatric: He has a normal mood and affect.    ED Course  Procedures (including critical care time)  Pt with recurrent testicular pain, urinary symptoms. Will check UA, [place] of scrotum.  Results for orders placed during the hospital encounter of 04/05/12  URINALYSIS, ROUTINE W REFLEX MICROSCOPIC      Component Value Range   Color, Urine YELLOW  YELLOW   APPearance CLEAR  CLEAR   Specific Gravity, Urine 1.022  1.005 - 1.030   pH 6.0  5.0 - 8.0   Glucose, UA NEGATIVE  NEGATIVE mg/dL   Hgb urine dipstick NEGATIVE  NEGATIVE   Bilirubin Urine NEGATIVE  NEGATIVE   Ketones,  ur NEGATIVE  NEGATIVE mg/dL   Protein, ur NEGATIVE  NEGATIVE mg/dL   Urobilinogen, UA 1.0  0.0 - 1.0 mg/dL   Nitrite NEGATIVE  NEGATIVE   Leukocytes, UA NEGATIVE  NEGATIVE   US Scrotum  04/05/2012  *RADIOLOGY REPORT*  Clinical Data:  Testicle pain.  SCROTAL ULTRASOUND DOPPLER ULTRASOUND OF THE TESTICLES  Technique: Complete ultrasound examination of the testicles, epididymis, and other  scrotal structures was performed.  Color and spectral Doppler ultrasound were also utilized to evaluate blood flow to the testicles.  Comparison:  Findings:  Right testis:  4.3 x 1.9 x 1.4 cm. Small calcification in the upper pole of the right testicle as well as a calcification in the head of the right epididymis.  Left testis:  3.9 x 2.0 x 3.4 cm.  Right epididymis:  Normal in size and appearance.  Left epididymis:  Normal in size and appearance.  Hydocele:  Bilateral.  Varicocele:  Absent.  Pulsed Doppler interrogation of both testes demonstrates low resistance flow bilaterally.  IMPRESSION:  1.  Normal perfusion to both testicles. 2.  Small bilateral hydroceles. 3.  Otherwise, normal exam.  Original Report Authenticated By: Gwynn Burly, M.D.   [place] Art/ven Flow Abd Pelv Doppler  04/05/2012  *RADIOLOGY REPORT*  Clinical Data:  Testicle pain.  SCROTAL ULTRASOUND DOPPLER ULTRASOUND OF THE TESTICLES  Technique: Complete ultrasound examination of the testicles, epididymis, and other scrotal structures was performed.  Color and spectral Doppler ultrasound were also utilized to evaluate blood flow to the testicles.  Comparison:  Findings:  Right testis:  4.3 x 1.9 x 1.4 cm. Small calcification in the upper pole of the right testicle as well as a calcification in the head of the right epididymis.  Left testis:  3.9 x 2.0 x 3.4 cm.  Right epididymis:  Normal in size and appearance.  Left epididymis:  Normal in size and appearance.  Hydocele:  Bilateral.  Varicocele:  Absent.  Pulsed Doppler interrogation of both testes demonstrates low resistance flow bilaterally.  IMPRESSION:  1.  Normal perfusion to both testicles. 2.  Small bilateral hydroceles. 3.  Otherwise, normal exam.  Original Report Authenticated By: Gwynn Burly, M.D.      1:39 PM Pt with negative [place] and UA. Very tender over prostate. Will treat for prostatitis with cipro and pain medications, and follow up.     1. Prostatitis        MDM         Lottie Mussel, PA 04/05/12 1633

## 2012-04-05 NOTE — Discharge Instructions (Signed)
Make sure to take all cipro as prescribed until gone. Continue ibuprofen for pain. Take vicodin for severe pain as prescribed. Follow up with urologist in the office as soon as possible.   Prostatitis Prostatitis is an inflammation (the body's way of reacting to injury and/or infection) of the prostate gland. The prostate gland is a male organ. The gland is about the size and shape of a walnut. The prostate is located just below the bladder. It produces semen, which is a fluid that helps nourish and transport sperm. Prostatitis is the most common urinary tract problem in men younger than age 83. There are 4 categories of prostatitis:  I - Acute bacterial prostatitis.   II - Chronic bacterial prostatitis.   III - Chronic prostatitis and chronic pelvic pain syndrome (CPPS).   Inflammatory.   Non inflammatory.   IV - Asymptomatic inflammatory prostatitis.  Acute and chronic bacterial prostatitis are problems with bacterial infections of the prostate. "Acute" infection is usually a one-time problem. "Chronic" bacterial prostatitis is a condition with recurrent infection. It is usually caused by the same germ(bacteria). CPPS has symptoms similar to prostate infection. However, no infection is actually found. This condition can cause problems of ongoing pain. Currently, it cannot be cured. Treatments are available and aimed at symptom control.  Asymptomatic inflammatory prostatitis has no symptoms. It is a condition where infection-fighting cells are found by chance in the urine. The diagnosis is made most often during an exam for other conditions. Other conditions could be infertility or a high level of PSA (prostate-specific antigen) in the blood. SYMPTOMS  Symptoms can vary depending upon the type of prostatitis that exists. There can also be overlap in symptoms. This can make diagnosis difficult. Symptoms: For Acute bacterial prostatitis  Painful urination.   Fever or chills.   Muscle or  joint pains.   Low back pain.   Low abdominal pain.   Inability to empty bladder completely.   Sudden urges to urinate.   Frequent urination during the day.   Difficulty starting urine stream.   Need to urinate several times at night (nocturia).   Weak urine stream.   Urethral (tube that carries urine from the bladder out of the body) discharge and dribbling after urination.  For Chronic bacterial prostatitis  Rectal pain.   Pain in the testicles, penis, or tip of the penis.   Pain in the space between the anus and scrotum (perineum).   Low back pain.   Low abdominal pain.   Problems with sexual function.   Painful ejaculation.   Bloody semen.   Inability to empty bladder completely.   Painful urination.   Sudden urges to urinate.   Frequent urination during the day.   Difficulty starting urine stream.   Need to urinate several times at night (nocturia).   Weak urine stream.   Dribbling after urination.   Urethral discharge.  For Chronic prostatitis and chronic pelvic pain syndrome (CPPS) Symptoms are the same as those for chronic bacterial prostatitis. Problems with sexual function are often the reason for seeking care. This important problem should be discussed with your caregiver. For Asymptomatic inflammatory prostatitis As noted above, there are no symptoms with this condition. DIAGNOSIS   Your caregiver may perform a rectal exam. This exam is to determine if the prostate is swollen and tender.   Sometimes blood work is performed. This is done to see if your white blood cell count is elevated. The Prostate Specific Antigen (PSA) is also measured.  PSA is a blood test that can help detect early prostate cancer.   A urinalysis is done to find out what type of infection is present if this is a suspected cause. An additional urinalysis may be done after a digital rectal exam. This is to see if white blood cells are pushed out of the prostate and into  the urine. A low-grade infection of the prostate may not be found on the first urinalysis.  In more difficult cases, your caregiver may advise other tests. Tests could include:  Urodynamics -- Tests the function of the bladder and the organs involved in triggering and controlling normal urination.   Urine flow rate.   Cystoscopy -- In this procedure, a thin, telescope-like tube with a light and tiny camera attached (cystoscope) is inserted into the bladder through the urethra. This allows the caregiver to see the inside of the urethra and bladder.   Electromyography -- This procedure tests how the muscles and nerves of the bladder work. It is focused on the muscles that control the anus and pelvic floor. These are the muscles between the anus and scrotum.  In people who show no signs of infection, certain uncommon infections might be causing constant or recurrent symptoms. These uncommon infections are difficult to detect. More work in medicine may help find solutions to these problems. TREATMENT  Antibiotics are used to treat infections caused by germs. If the infection is not treated and becomes long lasting (chronic), it may become a lower grade infection with minor, continual problems. Without treatment, the prostate may develop a boil or furuncle (abscess). This may require surgical treatment. For those with chronic prostatitis and CPPS, it is important to work closely with your primary caregiver and urologist. For some, the medicines that are used to treat a non-cancerous, enlarged prostate (benign prostatic hypertrophy) may be helpful. Referrals to specialists other than urologists may be necessary. In rare cases when all treatments have been inadequate for pain control, an operation to remove the prostate may be recommended. This is very rare and before this is considered thorough discussion with your urologist is highly recommended.  In cases of secondary to chronic non-bacterial prostatitis,  a good relationship with your urologist or primary caregiver is essential because it is often a recurrent prolonged condition that requires a good understanding of the causes and a commitment to therapy aimed at controlling your symptoms. HOME CARE INSTRUCTIONS   Hot sitz baths for 20 minutes, 4 times per day, may help relieve pain.   Non-prescription pain killers may be used as your caregiver recommends if you have no allergies to them. Some illnesses or conditions prevent use of non-prescription drugs. If unsure, check with your caregiver. Take all medications as directed. Take the antibiotics for the prescribed length of time, even if you are feeling better.  SEEK MEDICAL CARE IF:   You have any worsening of the symptoms that originally brought you to your caregiver.   You have an oral temperature above 102 F (38.9 C).   You experience any side effects from medications prescribed.  SEEK IMMEDIATE MEDICAL CARE IF:   You have an oral temperature above 102 F (38.9 C), not controlled by medicine.   You have pain not relieved with medications.   You develop nausea, vomiting, lightheadedness, or have a fainting episode.   You are unable to urinate.   You pass bloody urine or clots.  Document Released: 09/29/2000 Document Revised: 09/21/2011 Document Reviewed: 09/04/2011 [facility] Patient Information 2012 [place] 

## 2012-04-06 LAB — GC/CHLAMYDIA PROBE AMP, URINE
Chlamydia, Swab/Urine, PCR: NEGATIVE
GC Probe Amp, Urine: NEGATIVE

## 2012-06-28 ENCOUNTER — Ambulatory Visit (INDEPENDENT_AMBULATORY_CARE_PROVIDER_SITE_OTHER): Payer: Medicaid Other | Admitting: Family Medicine

## 2012-06-28 ENCOUNTER — Encounter: Payer: Self-pay | Admitting: Family Medicine

## 2012-06-28 VITALS — BP 132/83 | HR 71 | Temp 98.7°F | Ht 68.0 in | Wt 173.4 lb

## 2012-06-28 DIAGNOSIS — N433 Hydrocele, unspecified: Secondary | ICD-10-CM

## 2012-06-28 DIAGNOSIS — I10 Essential (primary) hypertension: Secondary | ICD-10-CM

## 2012-06-28 HISTORY — DX: Hydrocele, unspecified: N43.3

## 2012-06-28 LAB — BASIC METABOLIC PANEL
Calcium: 9.6 mg/dL (ref 8.4–10.5)
Potassium: 4.3 mEq/L (ref 3.5–5.3)
Sodium: 140 mEq/L (ref 135–145)

## 2012-06-28 LAB — CBC WITH DIFFERENTIAL/PLATELET
Basophils Absolute: 0 10*3/uL (ref 0.0–0.1)
Basophils Relative: 1 % (ref 0–1)
Eosinophils Absolute: 0.3 10*3/uL (ref 0.0–0.7)
Hemoglobin: 14.4 g/dL (ref 13.0–17.0)
MCHC: 34.4 g/dL (ref 30.0–36.0)
Neutro Abs: 1.9 10*3/uL (ref 1.7–7.7)
Neutrophils Relative %: 40 % — ABNORMAL LOW (ref 43–77)
Platelets: 308 10*3/uL (ref 150–400)
RDW: 14.2 % (ref 11.5–15.5)

## 2012-06-28 MED ORDER — LISINOPRIL 5 MG PO TABS: 5.0000 mg | ORAL_TABLET | Freq: Every day | ORAL | Status: DC

## 2012-06-28 NOTE — Assessment & Plan Note (Signed)
This is a presumptive diagnosis based on my exam findings.  Patient declined rectal/prostate exam but reports that he is not having "infection" symptoms.  I will obtain records from urology and, after review, discuss with the patient if he needs to go back to see them or if there is anything that I can do for him.  I tend to think that he will have to have further discussions with urology.

## 2012-06-28 NOTE — Assessment & Plan Note (Signed)
Currently normotensive.  Patient reports that "it gets high some times."  Will refill lisinopril, more to decrease patient anxiety than anything else.  Will also obtain blood work to monitor renal function and screen for DM.

## 2012-06-28 NOTE — Progress Notes (Signed)
Patient ID: Gerald Parker, male   DOB: 11-07-1970, 41 y.o.   MRN: [id number] Subjective: The patient is a 41 y.o. year old male who presents today for new patient appointment.  1. HTN:  Long standing issue.  On lisinopril 5mg  daily.  Needs refill.  No cp/sob.  2. Prostate issues: began 7-8 years ago.  Worse for past 3 years.  Having problems with feeling testicles are swelling and some groin pain, more on the left than the right.  Has seen urologist but reports being given motrin and "something to calm me down."  Occasional burning with urination.  Reports "all over burning."  Also reports what sounds like a hydrocele.  Has seen both Dr. Vonita Moss (who has retired) and Dr. Patsi Sears with [facility].  Married, monogamous, no h/o STI.  No fevers.  Patient's past medical, social, and family history were reviewed and updated as appropriate. History  Substance Use Topics  . Smoking status: Never Smoker   . Smokeless tobacco: Never Used  . Alcohol Use: No   Objective:  Filed Vitals:   06/28/12 0842  BP: 132/83  Pulse: 71  Temp: 98.7 F (37.1 C)   Gen: NAD, well developed, well groomed CV: RRR, no murmurs Resp: CTABL Abd: Soft, slight discomfort with deep palpation in suprapubic region, otherwise NTND Genital: Male genitalia: Penis: normal, no lesions and circumcised Urethral Meatus: normal and no discharge Testicles: right sided fullness, testicle palpable with no obvious masses, left testicle normal Scrotum: normal Prostate examination: exam declined by patient Ext: Warm, well perfused, no edema  Assessment/Plan:  Please also see individual problems in problem list for problem-specific plans.

## 2012-06-28 NOTE — Patient Instructions (Signed)
Hydrocele, Adult  Fluid can collect around the testicles. This fluid forms in a sac. This condition is called a hydrocele. The collected fluid causes swelling of the scrotum. Usually, it affects just one testicle. Most of the time, the condition does not cause pain. Sometimes, the hydrocele goes away on its own. Other times, surgery is needed to get rid of the fluid.  CAUSES  A hydrocele does not develop often. Different things can cause a hydrocele in a man, including:  · Injury to the scrotum.  · Infection.  · X-ray of the area around the scrotum.  · A tumor or cancer of the testicle.  · Twisting of a testicle.  · Decreased blood flow to the scrotum.  SYMPTOMS   · Swelling without pain. The hydrocele feels like a water-filled balloon.  · Swelling with pain. This can occur if the hydrocele was caused by infection or twisting.  · Mild discomfort in the scrotum.  · The hydrocele may feel heavy.  · Swelling that gets smaller when you lie down.  DIAGNOSIS   Your caregiver will do a physical exam to decide if you have a hydrocele. This may include:  · Asking questions about your overall health, today and in the past. Your caregiver may ask about any injuries, X-rays, or infections.  · Pushing on your abdomen or asking you to change positions to see if the size of the hydrocele changes.  · Shining a light through the scrotum (transillumination) to see if the fluid inside the scrotum is clear.  · Blood tests and urine tests to check for infection.  · Imaging studies that take pictures of the scrotum and testicles.  TREATMENT   Treatment depends in part on what caused the condition. Options include:  · Watchful waiting. Your caregiver checks the hydrocele every so often.  · Different surgeries to drain the fluid.  · A needle may be put into the scrotum to drain fluid (needle aspiration). Fluid often returns after this type of treatment.  · A cut (incision) may be made in the scrotum to remove the fluid sac  (hydrocelectomy).  · An incision may be made in the groin to repair a hydrocele that has contact with abdominal fluids (communicating hydrocele).  · Medicines to treat an infection (antibiotics).  HOME CARE INSTRUCTIONS   What you need to do at home may depend on the cause of the hydrocele and type of treatment. In general:  · Take all medicine as directed by your caregiver. Follow the directions carefully.  · Ask your caregiver if there is anything you should not do while you recover (activities, lifting, work, sex).  · If you had surgery to repair a communicating hydrocele, recovery time may vary. Ask you caregiver about your recovery time.  · Avoid heavy lifting for 4 to 6 weeks.  · If you had an incision on the scrotum or groin, wash it for 2 to 3 days after surgery. Do this as long as the skin is closed and there are no gaps in the wound. Wash gently, and avoid rubbing the incision.  · Keep all follow-up appointments.  SEEK MEDICAL CARE IF:   · Your scrotum seems to be getting larger.  · The area becomes more and more uncomfortable.  SEEK IMMEDIATE MEDICAL CARE IF:   You have a fever.  Document Released: 03/22/2010 Document Revised: 09/21/2011 Document Reviewed: 03/22/2010  [employer]® Patient Information ©2012 [employer].

## 2012-06-30 ENCOUNTER — Encounter: Payer: Self-pay | Admitting: Family Medicine

## 2012-08-06 ENCOUNTER — Ambulatory Visit: Payer: Medicaid Other | Admitting: Family Medicine

## 2012-08-07 ENCOUNTER — Emergency Department (HOSPITAL_COMMUNITY)
Admission: EM | Admit: 2012-08-07 | Discharge: 2012-08-07 | Disposition: A | Discharge status: Home / Self Care | Payer: Medicaid Other | Attending: Emergency Medicine | Admitting: Emergency Medicine

## 2012-08-07 ENCOUNTER — Emergency Department (HOSPITAL_COMMUNITY): Payer: Medicaid Other

## 2012-08-07 ENCOUNTER — Encounter (HOSPITAL_COMMUNITY): Payer: Self-pay | Admitting: Emergency Medicine

## 2012-08-07 DIAGNOSIS — X500XXA Overexertion from strenuous movement or load, initial encounter: Secondary | ICD-10-CM | POA: Insufficient documentation

## 2012-08-07 DIAGNOSIS — K219 Gastro-esophageal reflux disease without esophagitis: Secondary | ICD-10-CM | POA: Insufficient documentation

## 2012-08-07 DIAGNOSIS — IMO0002 Reserved for concepts with insufficient information to code with codable children: Secondary | ICD-10-CM | POA: Insufficient documentation

## 2012-08-07 DIAGNOSIS — Z87828 Personal history of other (healed) physical injury and trauma: Secondary | ICD-10-CM | POA: Insufficient documentation

## 2012-08-07 DIAGNOSIS — S8390XA Sprain of unspecified site of unspecified knee, initial encounter: Secondary | ICD-10-CM

## 2012-08-07 DIAGNOSIS — Y929 Unspecified place or not applicable: Secondary | ICD-10-CM | POA: Insufficient documentation

## 2012-08-07 DIAGNOSIS — Y939 Activity, unspecified: Secondary | ICD-10-CM | POA: Insufficient documentation

## 2012-08-07 DIAGNOSIS — Z79899 Other long term (current) drug therapy: Secondary | ICD-10-CM | POA: Insufficient documentation

## 2012-08-07 MED ORDER — IBUPROFEN 600 MG PO TABS: 600.0000 mg | ORAL_TABLET | Freq: Four times a day (QID) | ORAL | Status: DC | PRN

## 2012-08-07 MED ORDER — OXYCODONE-ACETAMINOPHEN 5-325 MG PO TABS: 2.0000 | ORAL_TABLET | ORAL | Status: DC | PRN

## 2012-08-07 NOTE — ED Notes (Signed)
Pt states he twisted R knee 4 days ago.  C/o pain and swelling.  CMS intact.

## 2012-08-07 NOTE — ED Provider Notes (Signed)
History   This chart was scribed for Toy Baker, MD by Charolett Bumpers . The patient was seen in room TR05C/TR05C. Patient's care was started at 1740.   CSN: [id number] Arrival date & time 08/07/12  1618  First MD Initiated Contact with Patient 08/07/12 1740      Chief Complaint  Patient presents with  . Knee Pain    The history is provided by the patient. No language interpreter was used.  Gerald Parker is a 41 y.o. male who presents to the Emergency Department complaining of right knee pain after twisting it 4 days ago. He reports associated swelling. He denies any falls. He reports a h/o injuries in his right knee. He states his knee pain is aggravated with walking and movement.    Past Medical History  Diagnosis Date  . GERD (gastroesophageal reflux disease)     History reviewed. No pertinent past surgical history.  Family History  Problem Relation Age of Onset  . Hypertension Mother   . Diabetes type II Mother   . Diabetes type II Father     History  Substance Use Topics  . Smoking status: Never Smoker   . Smokeless tobacco: Never Used  . Alcohol Use: No      Review of Systems  Constitutional: Negative for fever and chills.  Respiratory: Negative for shortness of breath.   Gastrointestinal: Negative for nausea and vomiting.  Musculoskeletal: Positive for arthralgias.       Right knee pain.   Neurological: Negative for weakness.  All other systems reviewed and are negative.    Allergies  Review of patient's allergies indicates no known allergies.  Home Medications   Current Outpatient Rx  Name Route Sig Dispense Refill  . LISINOPRIL 5 MG PO TABS Oral Take 1 tablet (5 mg total) by mouth daily. 90 tablet 3    BP 140/81  Pulse 84  Temp 98.7 F (37.1 C) (Oral)  Resp 16  SpO2 99%  Physical Exam  Nursing note and vitals reviewed. Constitutional: He is oriented to person, place, and time. He appears well-developed and well-nourished.   Non-toxic appearance.  HENT:  Head: Normocephalic and atraumatic.  Eyes: Conjunctivae normal are normal. Pupils are equal, round, and reactive to light.  Neck: Normal range of motion.  Cardiovascular: Normal rate.   Pulmonary/Chest: Effort normal.  Musculoskeletal: He exhibits tenderness.       Tenderness along LCL on right side. Slight patellar effusion noted. PCL and ACL tests negative.   Neurological: He is alert and oriented to person, place, and time.  Skin: Skin is warm and dry.  Psychiatric: He has a normal mood and affect.    ED Course  Procedures (including critical care time)  DIAGNOSTIC STUDIES: Oxygen Saturation is 99% on room air, normal by my interpretation.    COORDINATION OF CARE:  17:51-Discussed planned course of treatment with the patient including d/c home with knee sleeve, Percocet and Ibuprofen, who is agreeable at this time.    Labs Reviewed - No data to display Dg Knee Complete 4 Views Right  08/07/2012  *RADIOLOGY REPORT*  Clinical Data: Right knee injury with medial pain.  RIGHT KNEE - COMPLETE 4+ VIEW  Comparison: None.  Findings: Four views of the knee were obtained.  There is no evidence for acute fracture or dislocation.  Very mild degenerative changes involving the patellofemoral compartment.  There is no significant suprapatellar effusion.  IMPRESSION: No acute bony abnormality in the right knee.  Original Report Authenticated By: Richarda Overlie, M.D.      No diagnosis found.    MDM  Patient to be treated for knee sprain. Given knee sleeve and followup instructions    I personally performed the services described in this documentation, which was scribed in my presence. The recorded information has been reviewed and considered.     Toy Baker, MD 08/07/12 [phone]

## 2012-08-07 NOTE — Progress Notes (Signed)
Orthopedic Tech Progress Note Patient Details:  Gerald Parker [birth year] [id number]  Ortho Devices Type of Ortho Device: Knee Sleeve Ortho Device/Splint Location: (R) LE Ortho Device/Splint Interventions: Application   [name] 08/07/2012, 6:13 PM

## 2012-09-02 ENCOUNTER — Ambulatory Visit (INDEPENDENT_AMBULATORY_CARE_PROVIDER_SITE_OTHER): Payer: Medicaid Other | Admitting: Family Medicine

## 2012-09-02 ENCOUNTER — Encounter: Payer: Self-pay | Admitting: Family Medicine

## 2012-09-02 VITALS — BP 161/84 | HR 91 | Temp 98.4°F | Ht 68.0 in | Wt 174.0 lb

## 2012-09-02 DIAGNOSIS — M25561 Pain in right knee: Secondary | ICD-10-CM

## 2012-09-02 DIAGNOSIS — M25569 Pain in unspecified knee: Secondary | ICD-10-CM

## 2012-09-02 MED ORDER — MELOXICAM 15 MG PO TABS: 15.0000 mg | ORAL_TABLET | Freq: Every day | ORAL | Status: DC

## 2012-09-02 NOTE — Assessment & Plan Note (Signed)
A: Right Moderate patellofemoral syndrome on the right cannot rule out meniscal injury  Plan: Corticosteroid injection , ice, mobic. f/u for acute pain/redness/swelling that may be  suggestive of septic arthritis.  F/u with sports medicine if pain persist or worsens sub acutely.

## 2012-09-02 NOTE — Progress Notes (Signed)
Patient ID: Gerald Parker, male   DOB: [birth year], 41 y.o.   MRN: [id number] Subjective:    Gerald Parker is a 41 y.o. male who presents with knee pain involving the right knee. Onset was sudden, not related to any specific activity. Inciting event: none known, just started to have to pain upon wakening one morning. . Current symptoms include: crepitus sensation, locking, pain located superior-medial knee, popping sensation and swelling. Pain is aggravated by any weight bearing. Patient has had prior knee problems. Evaluation to date: plain films: normal. Treatment to date: ice and OTC analgesics which are somewhat effective.   Review of Systems Pertinent items are noted in HPI.   Objective:    BP 161/84  Pulse 91  Temp 98.4 F (36.9 C) (Oral)  Ht 5\' 8"  (1.727 m)  Wt 174 lb (78.926 kg)  BMI 26.46 kg/m2 Right knee: positive exam findings: medial joint line tenderness and pain with patellar compression and negative exam findings: no effusion, no erythema, McMurray's negative and FROM  Left knee:  normal and no effusion, full active range of motion, no joint line tenderness, ligamentous structures intact.   X-ray right knee: no fracture, dislocation, swelling or degenerative changes noted    A steroid injection was performed at R knee using anterior lateral approach using 1: of 2cc 1% plain Lidocaine and 2 cc 40 mg of Depo Medrol. This was well tolerated.  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 f/u for acute pain/redness/swelling that may be  suggestive of septic arthritis.  F/u with sports medicine if pain persist or worsens sub acutely.

## 2012-09-02 NOTE — Patient Instructions (Addendum)
Gerald Parker,  Thank you for coming in to see me today.  I have placed a shot of steroid and lidocaine into your knee joint. 1. Ice at home today. 2. Do not do extra activity, take it easy on your knee for next two days.  3. Start mobic daily, anti-inflammatory.   Schedule an appt with Sports Medicine. If your pain resolves you can cancel it.   Dr. Armen Pickup

## 2012-09-10 ENCOUNTER — Ambulatory Visit (INDEPENDENT_AMBULATORY_CARE_PROVIDER_SITE_OTHER): Payer: Medicaid Other | Admitting: Sports Medicine

## 2012-09-10 VITALS — BP 122/78 | Ht 68.0 in | Wt 172.0 lb

## 2012-09-10 DIAGNOSIS — M25561 Pain in right knee: Secondary | ICD-10-CM

## 2012-09-10 DIAGNOSIS — M25569 Pain in unspecified knee: Secondary | ICD-10-CM

## 2012-09-10 MED ORDER — MELOXICAM 15 MG PO TABS: 15.0000 mg | ORAL_TABLET | Freq: Every day | ORAL | Status: DC

## 2012-09-10 NOTE — Progress Notes (Signed)
  Subjective:    Patient ID: Gerald Parker, male    DOB: [birth year], 41 y.o.   MRN: [id number]  HPI chief complaint: Right knee pain  Very pleasant 41 year old comes in today complaining of 6 weeks of right knee pain. Pain began acutely while simply lying in bed. 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 Pain and popping at that time as well as some swelling which resolved without any surgery. Since then he's experienced occasional catching and popping in the knee but his symptoms have always resolved spontaneously. However this episode has progressed. He was seen in the emergency room and x-rays of his knee were obtained. They are available for review. He localizes all of his pain along the medial knee. It does want to give way. He's not noticed any swelling. He saw his primary care physician on November 18 and a intra-articular cortisone injection was administered. This is helped somewhat with his pain. He was also given a prescription for Mobic but failed to fill it. He denies any groin pain. No associated numbness or tingling. Symptoms improve at rest. No prior knee surgeries.  Medical history and current medications are reviewed    Review of Systems     Objective:   Physical Exam Well-developed, well-nourished. No acute distress. Awake alert and oriented x3  Right knee: Full range of motion. No effusion. No soft tissue swelling. He is tender to palpation along the medial joint line with pain but no popping with McMurray's maneuver. Positive Thessalys. Some tenderness to palpation at the pes anserine bursa. Knee is stable to valgus and varus stressing. Negative anterior drawer. Negative posterior drawer. Neurovascular intact distally. Walking with a slight limp.  MSK ultrasound of the right knee: Limited ultrasound of the medial right knee was performed. Visualized portion of the medial meniscus is intact. Tendons at [facility] tubercle are unremarkable. There  is no obvious fluid in the pes anserine bursa.         Assessment & Plan:  1. Right knee pain possibly secondary to medial meniscal  Based on today's ultrasound I will treat this patient with a knee compression sleeve. I've also given him a new prescription for Mobic 15 mg to take daily for 2 weeks. I've explained to him that cortisone injections can sometimes take 2-3 weeks before working. Patient return to the office in 2 weeks for followup. If symptoms persist I would consider merits of further diagnostic imaging to rule out an occult medial meniscal tear. Patient will call with questions or concerns in the interim.

## 2012-09-23 ENCOUNTER — Ambulatory Visit: Payer: Medicaid Other | Admitting: Sports Medicine

## 2012-09-30 ENCOUNTER — Ambulatory Visit (INDEPENDENT_AMBULATORY_CARE_PROVIDER_SITE_OTHER): Payer: Medicaid Other | Admitting: Sports Medicine

## 2012-09-30 ENCOUNTER — Encounter: Payer: Self-pay | Admitting: Sports Medicine

## 2012-09-30 VITALS — BP 150/88 | HR 80 | Ht 68.0 in | Wt 172.0 lb

## 2012-09-30 DIAGNOSIS — M25569 Pain in unspecified knee: Secondary | ICD-10-CM

## 2012-09-30 DIAGNOSIS — M25561 Pain in right knee: Secondary | ICD-10-CM

## 2012-09-30 NOTE — Patient Instructions (Addendum)
You have been scheduled for a MRI   Wednesday 10/02/12 11:30am  [facility] [address] [employer] [phone]  Please schedule a follow up appointment with Dr. Margaretha Sheffield at 8:30 am Friday this morning 10/04/12  Thank you for seeing Gerald Parker today!

## 2012-10-01 NOTE — Progress Notes (Signed)
  Subjective:    Patient ID: Gerald Parker, male    DOB: 09/03/1971, 41 y.o.   MRN: [id number]  HPI Patient comes in today for followup on his right knee. Overall, his pain has improved but he is still getting significant mechanical symptoms. He states that his knee will catch and lock from time to time. His discomfort is still localized to the medial aspect of his knee. Previous x-rays of this knee were basically unremarkable. The cortisone injection has been somewhat helpful but not curative. Mobic has been ineffective and the knee sleeve is too tight. He denies any swelling. Symptoms improve at rest.    Review of Systems     Objective:   Physical Exam Well-developed, well-nourished. No acute distress. Awake alert and oriented x3  Right knee: Full range of motion. No effusion. There is tenderness to palpation along the medial joint line. Positive McMurray's on today's exam. Positive Thessaly. The knee remained stable to ligamentous exam. No tenderness along the lateral joint line. Neurovascularly intact distally. Walking with only a mild limp.       Assessment & Plan:  1. Persistent right knee pain-rule out occult loose body versus medial meniscal tear  Given his persistent mechanical symptoms despite conservative treatment to date, I think we should pursue an MRI scan of his knee specifically to rule out an occult loose body or possible medial meniscal tear. Patient will followup with me in the days following his scan to go over those results at which time we will delineate further treatment.

## 2012-10-02 ENCOUNTER — Ambulatory Visit
Admission: RE | Admit: 2012-10-02 | Discharge: 2012-10-02 | Disposition: A | Discharge status: Home / Self Care | Payer: Medicaid Other | Source: Ambulatory Visit | Attending: Sports Medicine | Admitting: Sports Medicine

## 2012-10-02 ENCOUNTER — Telehealth: Payer: Self-pay | Admitting: *Deleted

## 2012-10-02 DIAGNOSIS — M25561 Pain in right knee: Secondary | ICD-10-CM

## 2012-10-02 NOTE — Telephone Encounter (Signed)
Message copied by Mora Bellman on Wed Oct 02, 2012  4:46 PM ------      Message from: CERESI, MELANIE L      Created: Wed Oct 02, 2012  2:27 PM      Regarding: phone message       Victorino Dike from [facility] called stated that this pt didn't complete his mri, they were going to reschedule him on the open scanner but he just left.        He had an appt here on Friday to get results, I'm going to cancel that appt.

## 2012-10-02 NOTE — Telephone Encounter (Signed)
Pt got anxious during MRI and was in pain, was unable to complete the scan.  He does not want to reschedule at this time.  States he will call [facility] [facility] and reschedule if he decides to procede with MRI.

## 2012-10-04 ENCOUNTER — Ambulatory Visit: Payer: Medicaid Other | Admitting: Sports Medicine

## 2012-10-13 ENCOUNTER — Other Ambulatory Visit: Payer: Self-pay | Admitting: Sports Medicine

## 2012-10-13 ENCOUNTER — Inpatient Hospital Stay: Admission: RE | Admit: 2012-10-13 | Payer: Medicaid Other | Source: Ambulatory Visit

## 2012-10-13 ENCOUNTER — Ambulatory Visit: Admission: RE | Admit: 2012-10-13 | Payer: Medicaid Other | Source: Ambulatory Visit

## 2012-10-13 ENCOUNTER — Inpatient Hospital Stay
Admission: RE | Admit: 2012-10-13 | Discharge: 2012-10-13 | Disposition: A | Discharge status: Home / Self Care | Payer: Medicaid Other | Source: Ambulatory Visit

## 2012-10-13 ENCOUNTER — Ambulatory Visit
Admission: RE | Admit: 2012-10-13 | Discharge: 2012-10-13 | Disposition: A | Discharge status: Home / Self Care | Payer: Medicaid Other | Source: Ambulatory Visit | Attending: Sports Medicine | Admitting: Sports Medicine

## 2012-10-13 DIAGNOSIS — M25569 Pain in unspecified knee: Secondary | ICD-10-CM

## 2012-10-21 ENCOUNTER — Ambulatory Visit (INDEPENDENT_AMBULATORY_CARE_PROVIDER_SITE_OTHER): Payer: Medicaid Other | Admitting: Sports Medicine

## 2012-10-21 VITALS — BP 132/78 | Ht 68.0 in | Wt 170.0 lb

## 2012-10-21 DIAGNOSIS — S83209A Unspecified tear of unspecified meniscus, current injury, unspecified knee, initial encounter: Secondary | ICD-10-CM

## 2012-10-21 DIAGNOSIS — IMO0002 Reserved for concepts with insufficient information to code with codable children: Secondary | ICD-10-CM

## 2012-10-21 NOTE — Patient Instructions (Addendum)
You have been scheduled for an appointment with Dr. Thurston Hole at [facility] for tomorrow 10/22/12 at 11 am Their address is [address] [place] [address] [phone]

## 2012-10-22 NOTE — Progress Notes (Signed)
Patient ID: Gerald Parker, male   DOB: 24-Aug-1971, 42 y.o.   MRN: [id number]  Patient comes in today to go over MRI findings of his right knee. MRI shows an extensive horizontal tear through the mid body and posterior horn of the medial meniscus with a displaced meniscal fragment into the inferior gutter adjacent to the mid body. Cruciate and collateral ligaments are intact. Patient continues to have pain and mechanical symptoms. I've recommended referral to Dr. Thurston Hole to discuss merits of arthroscopic meniscectomy. Patient will see Dr. Thurston Hole tomorrow morning and at this point I will defer further treatment to his discretion. Patient will followup with me when necessary.

## 2012-11-03 ENCOUNTER — Emergency Department (HOSPITAL_COMMUNITY): Payer: Medicaid Other

## 2012-11-03 ENCOUNTER — Emergency Department (HOSPITAL_COMMUNITY)
Admission: EM | Admit: 2012-11-03 | Discharge: 2012-11-03 | Disposition: A | Discharge status: Home / Self Care | Payer: Medicaid Other | Attending: Emergency Medicine | Admitting: Emergency Medicine

## 2012-11-03 ENCOUNTER — Encounter (HOSPITAL_COMMUNITY): Payer: Self-pay | Admitting: Family Medicine

## 2012-11-03 DIAGNOSIS — Z79899 Other long term (current) drug therapy: Secondary | ICD-10-CM | POA: Insufficient documentation

## 2012-11-03 DIAGNOSIS — K859 Acute pancreatitis without necrosis or infection, unspecified: Secondary | ICD-10-CM | POA: Insufficient documentation

## 2012-11-03 DIAGNOSIS — Z8719 Personal history of other diseases of the digestive system: Secondary | ICD-10-CM | POA: Insufficient documentation

## 2012-11-03 LAB — COMPREHENSIVE METABOLIC PANEL
ALT: 16 U/L (ref 0–53)
AST: 15 U/L (ref 0–37)
Albumin: 3.9 g/dL (ref 3.5–5.2)
CO2: 25 mEq/L (ref 19–32)
Calcium: 9.6 mg/dL (ref 8.4–10.5)
Chloride: 99 mEq/L (ref 96–112)
Creatinine, Ser: 0.78 mg/dL (ref 0.50–1.35)
GFR calc non Af Amer: >90 mL/min (ref >90)
Sodium: 136 mEq/L (ref 135–145)
Total Bilirubin: 0.3 mg/dL (ref 0.3–1.2)

## 2012-11-03 LAB — URINALYSIS, ROUTINE W REFLEX MICROSCOPIC
Glucose, UA: NEGATIVE mg/dL
Leukocytes, UA: NEGATIVE
Nitrite: NEGATIVE
pH: 6 (ref 5.0–8.0)

## 2012-11-03 LAB — CBC WITH DIFFERENTIAL/PLATELET
Eosinophils Absolute: 0.2 10*3/uL (ref 0.0–0.7)
Eosinophils Relative: 4 % (ref 0–5)
HCT: 42.2 % (ref 39.0–52.0)
Lymphocytes Relative: 39 % (ref 12–46)
Lymphs Abs: 2.2 10*3/uL (ref 0.7–4.0)
MCH: 30.5 pg (ref 26.0–34.0)
MCV: 83.1 fL (ref 78.0–100.0)
Monocytes Absolute: 0.6 10*3/uL (ref 0.1–1.0)
Monocytes Relative: 10 % (ref 3–12)
Platelets: 306 10*3/uL (ref 150–400)
RBC: 5.08 MIL/uL (ref 4.22–5.81)
WBC: 5.7 10*3/uL (ref 4.0–10.5)

## 2012-11-03 MED ORDER — OXYCODONE-ACETAMINOPHEN 5-325 MG PO TABS: 1.0000 | ORAL_TABLET | ORAL | Status: DC | PRN

## 2012-11-03 MED ORDER — METOCLOPRAMIDE HCL 10 MG PO TABS: 10.0000 mg | ORAL_TABLET | Freq: Four times a day (QID) | ORAL | Status: DC | PRN

## 2012-11-03 MED ORDER — SODIUM CHLORIDE 0.9 % IV SOLN
Freq: Once | INTRAVENOUS | Status: AC
  Administered 2012-11-03: 12:00:00 via INTRAVENOUS

## 2012-11-03 MED ORDER — IOHEXOL 300 MG/ML  SOLN
100.0000 mL | Freq: Once | INTRAMUSCULAR | Status: AC | PRN
  Administered 2012-11-03: 100 mL via INTRAVENOUS

## 2012-11-03 MED ORDER — OXYCODONE-ACETAMINOPHEN 5-325 MG PO TABS
1.0000 | ORAL_TABLET | Freq: Once | ORAL | Status: AC
  Administered 2012-11-03: 1 via ORAL
  Filled 2012-11-03: qty 1

## 2012-11-03 MED ORDER — IOHEXOL 300 MG/ML  SOLN
50.0000 mL | Freq: Once | INTRAMUSCULAR | Status: AC | PRN
  Administered 2012-11-03: 50 mL via ORAL

## 2012-11-03 MED ORDER — KETOROLAC TROMETHAMINE 30 MG/ML IJ SOLN
30.0000 mg | Freq: Once | INTRAMUSCULAR | Status: AC
  Administered 2012-11-03: 30 mg via INTRAVENOUS
  Filled 2012-11-03: qty 1

## 2012-11-03 NOTE — ED Provider Notes (Signed)
History     CSN: [id number]  Arrival date & time 11/03/12  1014   First MD Initiated Contact with Patient 11/03/12 1109      Chief Complaint  Patient presents with  . Flank Pain    (Consider location/radiation/quality/duration/timing/severity/associated sxs/prior treatment) Patient is a 42 y.o. male presenting with flank pain. The history is provided by the patient.  Flank Pain  He has been having pain in both flanks radiating to his abdomen. Pain started 2 days ago. It tends to wax and wane but is severe when present. It seems to be worse when he stands up and better if he lays down. There is no associated nausea, vomiting, diarrhea. Denies dysuria. He denies fever, chills, sweats. He has difficulty assigning a #2 the pain. You start taking ibuprofen with little relief. He had a similar episode one year ago which was blamed on muscle spasm. He denies knee, unusual bending or lifting. Appetite has been good. Pain is not affected by it.  Past Medical History  Diagnosis Date  . GERD (gastroesophageal reflux disease)     History reviewed. No pertinent past surgical history.  Family History  Problem Relation Age of Onset  . Hypertension Mother   . Diabetes type II Mother   . Diabetes type II Father     History  Substance Use Topics  . Smoking status: Never Smoker   . Smokeless tobacco: Never Used  . Alcohol Use: No      Review of Systems  Genitourinary: Positive for flank pain.  All other systems reviewed and are negative.    Allergies  Review of patient's allergies indicates no known allergies.  Home Medications   Current Outpatient Rx  Name  Route  Sig  Dispense  Refill  . HYDROCODONE-ACETAMINOPHEN 5-325 MG PO TABS   Oral   Take 1 tablet by mouth every 6 (six) hours as needed. For pain         . IBUPROFEN 600 MG PO TABS   Oral   Take 600 mg by mouth every 8 (eight) hours as needed. For pain         . LISINOPRIL 5 MG PO TABS   Oral   Take 5 mg by  mouth every evening.         Marland Kitchen MELOXICAM 15 MG PO TABS   Oral   Take 1 tablet (15 mg total) by mouth daily.   40 tablet   0     BP 132/85  Pulse 68  Temp 98 F (36.7 C) (Oral)  Resp 16  SpO2 99%  Physical Exam  Nursing note and vitals reviewed.  42 year old male, resting comfortably and in no acute distress. Vital signs are normal. Oxygen saturation is 99%, which is normal. Head is normocephalic and atraumatic. PERRLA, EOMI. Oropharynx is clear. Neck is nontender and supple without adenopathy or JVD. Back is nontender in the midline. There is mild bilateral CVA tenderness. Lungs are clear without rales, wheezes, or rhonchi. Chest is nontender. Heart has regular rate and rhythm without murmur. Abdomen is soft, flat, with mild lower abdominal tenderness which is poorly localized. There is no rebound or guarding. There are no masses or hepatosplenomegaly and peristalsis is normoactive. Extremities have no cyanosis or edema, full range of motion is present. Skin is warm and dry without rash. Neurologic: Mental status is normal, cranial nerves are intact, there are no motor or sensory deficits.  ED Course  Procedures (including critical care time)  Results  for orders placed during the hospital encounter of 11/03/12  URINALYSIS, ROUTINE W REFLEX MICROSCOPIC      Component Value Range   Color, Urine YELLOW  YELLOW   APPearance CLEAR  CLEAR   Specific Gravity, Urine 1.013  1.005 - 1.030   pH 6.0  5.0 - 8.0   Glucose, UA NEGATIVE  NEGATIVE mg/dL   Hgb urine dipstick NEGATIVE  NEGATIVE   Bilirubin Urine NEGATIVE  NEGATIVE   Ketones, ur NEGATIVE  NEGATIVE mg/dL   Protein, ur NEGATIVE  NEGATIVE mg/dL   Urobilinogen, UA 0.2  0.0 - 1.0 mg/dL   Nitrite NEGATIVE  NEGATIVE   Leukocytes, UA NEGATIVE  NEGATIVE  COMPREHENSIVE METABOLIC PANEL      Component Value Range   Sodium 136  135 - 145 mEq/L   Potassium 3.9  3.5 - 5.1 mEq/L   Chloride 99  96 - 112 mEq/L   CO2 25  19 - 32  mEq/L   Glucose, Bld 102 (*) 70 - 99 mg/dL   BUN 11  6 - 23 mg/dL   Creatinine, Ser 3.08  0.50 - 1.35 mg/dL   Calcium 9.6  8.4 - 65.7 mg/dL   Total Protein 7.9  6.0 - 8.3 g/dL   Albumin 3.9  3.5 - 5.2 g/dL   AST 15  0 - 37 U/L   ALT 16  0 - 53 U/L   Alkaline Phosphatase 81  39 - 117 U/L   Total Bilirubin 0.3  0.3 - 1.2 mg/dL   GFR calc non Af Amer >90  >90 mL/min   GFR calc Af Amer >90  >90 mL/min  LIPASE, BLOOD      Component Value Range   Lipase 68 (*) 11 - 59 U/L  CBC WITH DIFFERENTIAL      Component Value Range   WBC 5.7  4.0 - 10.5 K/uL   RBC 5.08  4.22 - 5.81 MIL/uL   Hemoglobin 15.5  13.0 - 17.0 g/dL   HCT 84.6  96.2 - 95.2 %   MCV 83.1  78.0 - 100.0 fL   MCH 30.5  26.0 - 34.0 pg   MCHC 36.7 (*) 30.0 - 36.0 g/dL   RDW 84.1  32.4 - 40.1 %   Platelets 306  150 - 400 K/uL   Neutrophils Relative 45  43 - 77 %   Neutro Abs 2.6  1.7 - 7.7 K/uL   Lymphocytes Relative 39  12 - 46 %   Lymphs Abs 2.2  0.7 - 4.0 K/uL   Monocytes Relative 10  3 - 12 %   Monocytes Absolute 0.6  0.1 - 1.0 K/uL   Eosinophils Relative 4  0 - 5 %   Eosinophils Absolute 0.2  0.0 - 0.7 K/uL   Basophils Relative 1  0 - 1 %   Basophils Absolute 0.1  0.0 - 0.1 K/uL   Ct Abdomen Pelvis W Contrast  11/03/2012  *RADIOLOGY REPORT*  Clinical Data: Back / flank pain for 2 days.  History gastroesophageal reflux disease.  Normal white blood cell count without evidence of hematuria.  CT ABDOMEN AND PELVIS WITH CONTRAST  Technique:  Multidetector CT imaging of the abdomen and pelvis was performed following the standard protocol during bolus administration of intravenous contrast.  Contrast: OMNIPAQUE IOHEXOL 300 MG/ML  SOLN, 50mL OMNIPAQUE IOHEXOL 300 MG/ML  SOLN  Comparison: 11/10/2011 CT.  Findings: Lung bases:  Clear lung bases.  Normal heart size without pericardial or pleural effusion.  Abdomen/pelvis:  Normal liver.  Splenules.  Normal stomach, pancreas, adrenal glands.  The gallbladder is partially  contracted, without biliary ductal dilatation.  Normal kidneys, with the right extrarenal pelvis.  No retroperitoneal or retrocrural adenopathy.  Normal colon, appendix, and terminal ileum.  Normal small bowel without abdominal ascites.    No pelvic adenopathy.  Normal urinary bladder.  Moderate prostatomegaly. No significant free fluid.  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  Disc bulge at the lumbosacral junction (presuming a S1 transitional vertebral body.  IMPRESSION:  1. No acute process in the abdomen or pelvis. 2.  Prostatomegaly.   Original Report Authenticated By: Jeronimo Greaves, M.D.    Mr Knee Right Wo Contrast  10/13/2012  *RADIOLOGY REPORT*  Clinical Data:  Persistent right knee pain since a twisting injury in October 2013.  MRI OF THE RIGHT KNEE WITHOUT CONTRAST  Technique:  Multiplanar, multisequence MR imaging of the right knee was performed.  No intravenous contrast was administered.  Comparison:  Radiographs dated 08/07/2012  FINDINGS: MENISCI Medial:  There is an extensive horizontal tear of the mid body and posterior horn of the medial meniscus.  There is a fragment of the undersurface of the posterior horn which is flipped into the inferior gutter at the level of the mid body. Lateral:  Normal.  LIGAMENTS Cruciates:  Normal. Collaterals:  There is fluid deep to the medial collateral ligament consistent with medial bursitis secondary to the meniscal tear. The ligament itself is normal.  Lateral collateral ligament is normal.  CARTILAGE Patellofemoral:  Small focal fissure in the central portion of the medial facet of the patella. Medial:  Normal. Lateral:  Normal.  Joint:  Small joint effusion. Popliteal Fossa:  Small Baker's cyst. Extensor Mechanism:  No significant abnormality. Bones: No significant abnormality.  IMPRESSION: Extensive horizontal tear of the mid body and posterior horn of the medial meniscus with a displaced meniscal fragment into the  inferior gutter adjacent to the mid body.   Original Report Authenticated By: Francene Boyers, M.D.       1. Pancreatitis       MDM  Flank and abdominal pain of uncertain cause. Do not see evidence of serious pathology at this time. Old records are reviewed and he did have a CT of the abdomen and pelvis one year ago which was a renal stone study which was negative. Laboratory workup and CT scan have been ordered.   Workup is significant for elevated lipase which may explain his symptoms. He is given a prescription for Percocet for pain and Reglan for nausea and is to followup with his PCP in one week for reevaluation.  Dione Booze, MD 11/03/12 1349

## 2012-11-03 NOTE — ED Notes (Signed)
Per pt 2 days of back pain , flank pain and abdominal pain. Denies urinary symptoms. Denies N,V,D.

## 2012-11-03 NOTE — ED Notes (Signed)
Dr. Glick at bedside.  

## 2012-11-03 NOTE — ED Notes (Signed)
CT notified pt finished with contrast. 

## 2012-11-03 NOTE — ED Notes (Signed)
Pt returned from CT °

## 2013-08-05 ENCOUNTER — Ambulatory Visit (INDEPENDENT_AMBULATORY_CARE_PROVIDER_SITE_OTHER): Payer: Medicaid Other | Admitting: Emergency Medicine

## 2013-08-05 VITALS — BP 140/84 | HR 127 | Temp 102.6°F | Ht 68.0 in | Wt 178.0 lb

## 2013-08-05 DIAGNOSIS — J029 Acute pharyngitis, unspecified: Secondary | ICD-10-CM

## 2013-08-05 DIAGNOSIS — J020 Streptococcal pharyngitis: Secondary | ICD-10-CM | POA: Insufficient documentation

## 2013-08-05 MED ORDER — KETOROLAC TROMETHAMINE 60 MG/2ML IM SOLN
60.0000 mg | Freq: Once | INTRAMUSCULAR | Status: AC
  Administered 2013-08-05: 60 mg via INTRAMUSCULAR

## 2013-08-05 MED ORDER — OSELTAMIVIR PHOSPHATE 75 MG PO CAPS: 75.0000 mg | ORAL_CAPSULE | Freq: Two times a day (BID) | ORAL | Status: DC

## 2013-08-05 MED ORDER — AMOXICILLIN 875 MG PO TABS: 875.0000 mg | ORAL_TABLET | Freq: Two times a day (BID) | ORAL | Status: DC

## 2013-08-05 MED ORDER — CEFTRIAXONE SODIUM 1 G IJ SOLR
250.0000 mg | Freq: Once | INTRAMUSCULAR | Status: AC
Start: 2013-08-05 — End: 2013-08-05
  Administered 2013-08-05: 250 mg via INTRAMUSCULAR

## 2013-08-05 MED ORDER — DEXAMETHASONE SODIUM PHOSPHATE 10 MG/ML IJ SOLN
10.0000 mg | Freq: Once | INTRAMUSCULAR | Status: AC
  Administered 2013-08-05: 10 mg via INTRAMUSCULAR

## 2013-08-05 NOTE — Progress Notes (Signed)
  Subjective:    Patient ID: Gerald Parker, male    DOB: 1971/04/07, 42 y.o.   MRN: [id number]  HPI Gerald Parker is here for a SDA for fevers and sore throat.  He reports developing fevers and sore throat on Sunday night.  He is unable to tolerate food secondary to the sore throat.  Also with headache and back ache.  + fatigue.  Denies nausea, vomiting, diarrhea, cough, nasal symptoms.  No neck stiffness or pain; although extremes of flexion and extension make his throat hurt.  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  He is from [place], but neither he nor his family has traveled in the last month.  I have reviewed and updated the following as appropriate: allergies and current medications SHx: never smoker   Review of Systems See HPI    Objective:   Physical Exam BP 140/84  Pulse 127  Temp(Src) 102.6 F (39.2 C) (Oral)  Ht 5\' 8"  (1.727 m)  Wt 178 lb (80.74 kg)  BMI 27.07 kg/m2 Gen: alert, cooperative, NAD, appears tired HEENT: AT/[place], sclera white, MMM, moderate to severe pharyngeal erythema, no exudate; TMs normal bilaterally; normal nasal mucosa Neck: supple, submandibular LAD CV: RRR, no murmurs Pulm: CTAB, no wheezes or rales      Assessment & Plan:

## 2013-08-05 NOTE — Patient Instructions (Signed)
It was nice to meet you! I'm sorry you do not feel well.  You have strep throat.  You may also have the flu. We gave you a few shots today to help with symptoms and start treatment.  Please get the amoxicillin today.  Start taking it tomorrow morning.  1 pill twice a day for 10 days. Take the Tamiflu 1 pill twice a day for 5 days for possible flu.  Please try to drink small amounts of fluids throughout the day.  Follow up in you are not improving with the antibiotics.

## 2013-08-05 NOTE — Addendum Note (Signed)
Addended by: Tanna Savoy on: 08/05/2013 10:58 AM   Modules accepted: Orders

## 2013-08-05 NOTE — Assessment & Plan Note (Signed)
Rapid strep is positive. Also with symptoms concerned for flu. Will give toradol 60mg  IM, dexamethasone 10mg  IM to help with sore throat and headache. Ceftriaxone 250mg  IM today. Start tamiflu 75mg  BID x5 days today. Start amoxicillin 875mg  BID x10 days tomorrow. Follow up in 2-3 days if not improving with medication.

## 2013-08-11 ENCOUNTER — Ambulatory Visit (INDEPENDENT_AMBULATORY_CARE_PROVIDER_SITE_OTHER): Payer: Medicaid Other | Admitting: Family Medicine

## 2013-08-11 ENCOUNTER — Encounter: Payer: Self-pay | Admitting: Family Medicine

## 2013-08-11 VITALS — BP 158/85 | HR 97 | Temp 98.0°F | Ht 68.0 in | Wt 178.0 lb

## 2013-08-11 DIAGNOSIS — M545 Low back pain, unspecified: Secondary | ICD-10-CM

## 2013-08-11 DIAGNOSIS — R52 Pain, unspecified: Secondary | ICD-10-CM

## 2013-08-11 DIAGNOSIS — M549 Dorsalgia, unspecified: Secondary | ICD-10-CM

## 2013-08-11 LAB — POCT URINALYSIS DIPSTICK
Glucose, UA: NEGATIVE
Leukocytes, UA: NEGATIVE
Protein, UA: NEGATIVE
Spec Grav, UA: 1.01
Urobilinogen, UA: 0.2

## 2013-08-11 LAB — CBC WITH DIFFERENTIAL/PLATELET
Basophils Absolute: 0.1 10*3/uL (ref 0.0–0.1)
Eosinophils Relative: 5 % (ref 0–5)
Lymphocytes Relative: 34 % (ref 12–46)
Lymphs Abs: 3.4 10*3/uL (ref 0.7–4.0)
MCV: 81.4 fL (ref 78.0–100.0)
Neutro Abs: 5 10*3/uL (ref 1.7–7.7)
Neutrophils Relative %: 49 % (ref 43–77)
Platelets: 418 10*3/uL — ABNORMAL HIGH (ref 150–400)
RBC: 5.21 MIL/uL (ref 4.22–5.81)
RDW: 14.6 % (ref 11.5–15.5)
WBC: 10.1 10*3/uL (ref 4.0–10.5)

## 2013-08-11 LAB — COMPREHENSIVE METABOLIC PANEL
ALT: 17 U/L (ref 0–53)
AST: 9 U/L (ref 0–37)
Albumin: 4.1 g/dL (ref 3.5–5.2)
CO2: 27 mEq/L (ref 19–32)
Calcium: 9.3 mg/dL (ref 8.4–10.5)
Chloride: 99 mEq/L (ref 96–112)
Creat: 0.8 mg/dL (ref 0.50–1.35)
Potassium: 4.2 mEq/L (ref 3.5–5.3)
Sodium: 136 mEq/L (ref 135–145)
Total Protein: 7.2 g/dL (ref 6.0–8.3)

## 2013-08-11 MED ORDER — CYCLOBENZAPRINE HCL 10 MG PO TABS: 10.0000 mg | ORAL_TABLET | Freq: Three times a day (TID) | ORAL | Status: DC | PRN

## 2013-08-11 MED ORDER — KETOROLAC TROMETHAMINE 30 MG/ML IJ SOLN
30.0000 mg | Freq: Once | INTRAMUSCULAR | Status: AC
  Administered 2013-08-11: 30 mg via INTRAMUSCULAR

## 2013-08-11 MED ORDER — IBUPROFEN 600 MG PO TABS: 600.0000 mg | ORAL_TABLET | Freq: Four times a day (QID) | ORAL | Status: DC | PRN

## 2013-08-11 MED ORDER — KETOROLAC TROMETHAMINE 30 MG/ML IM SOLN: 30.0000 mg | Freq: Once | INTRAMUSCULAR | Status: DC

## 2013-08-11 NOTE — Patient Instructions (Signed)
I think the pain you are having is from a muscle spasm, but I want to make sure that you do not have a pneumonia. Please go to [facility] or the hospital to get an xray of your chest.

## 2013-08-11 NOTE — Progress Notes (Signed)
Patient ID: Shuayb Schepers Hegel    DOB: 1971/08/13, 42 y.o.   MRN: [id number] --- Subjective:  Kashis is a 42 y.o.male who presents for follow up on flu symptoms. He was seen last week for cough, sore throat and myalgias. He was found to have strep throat and was treated. He did not finish the oral portion of antibiotics (stopped taking them on Friday).  He states that he no longer has cough, dyspnea, but Friday he started having pain in his mid back bilaterally. He states that "his kidneys hurt". He states that it hurts to walk as well as to move around. Better with laying down. No dysuria, no increased urinary frequency. He went back to work on Thursday and did some heavy lifting. He doesn't recall if he started hurting after that.  He denies any constipation, any diarrhea. He states he continues to have a fever and that the pain in his back is worst when he takes a deep breath.   ROS: see HPI Past Medical History: reviewed and updated medications and allergies. Social History: Tobacco: none  Objective: Filed Vitals:   08/11/13 1208  BP: 158/85  Pulse: 97  Temp: 98 F (36.7 C)    Physical Examination:   General appearance - alert, well appearing, and in no distress Nose - erythema and congestion bilaterally Mouth - mucous membranes moist, pharynx normal without lesions Neck - supple, no significant adenopathy Chest - clear to auscultation, no wheezes, rales or rhonchi, symmetric air entry Heart - normal rate, regular rhythm, normal S1, S2, no murmurs Abdomen - soft, some discomfort with deep palpation of right upper quadrant, nno rebound, no guarding Back - no CVA, tenderness along paralumbar muscles along thoracic spine on right more than left, with muscle tightness present.

## 2013-08-12 DIAGNOSIS — M549 Dorsalgia, unspecified: Secondary | ICD-10-CM | POA: Insufficient documentation

## 2013-08-12 NOTE — Assessment & Plan Note (Signed)
Thoracic muscular back pain. UA negative for infection. PAtient is convinced this is from his kidneys and from the flu. I think this is a muscle sprain, but since he was recently sick with strep URI and since he has worsening pain with breathing, cannot completely exclude that this isn't referred pain from pneumonia. Although the lung exam was unremarkable.  - will check chest xray - cbc for any elevated WBC and cmet (in context of some mild right upper quadrant discomfort) - toradol for relief at request of patient and home NSAIDs.

## 2013-08-13 ENCOUNTER — Emergency Department (HOSPITAL_COMMUNITY): Payer: Medicaid Other

## 2013-08-13 ENCOUNTER — Encounter (HOSPITAL_COMMUNITY): Payer: Self-pay | Admitting: Emergency Medicine

## 2013-08-13 ENCOUNTER — Emergency Department (HOSPITAL_COMMUNITY)
Admission: EM | Admit: 2013-08-13 | Discharge: 2013-08-13 | Disposition: A | Discharge status: Home / Self Care | Payer: Medicaid Other | Attending: Emergency Medicine | Admitting: Emergency Medicine

## 2013-08-13 DIAGNOSIS — R1011 Right upper quadrant pain: Secondary | ICD-10-CM | POA: Insufficient documentation

## 2013-08-13 DIAGNOSIS — Z79899 Other long term (current) drug therapy: Secondary | ICD-10-CM | POA: Insufficient documentation

## 2013-08-13 DIAGNOSIS — Z792 Long term (current) use of antibiotics: Secondary | ICD-10-CM | POA: Insufficient documentation

## 2013-08-13 DIAGNOSIS — R109 Unspecified abdominal pain: Secondary | ICD-10-CM

## 2013-08-13 DIAGNOSIS — K219 Gastro-esophageal reflux disease without esophagitis: Secondary | ICD-10-CM | POA: Insufficient documentation

## 2013-08-13 LAB — CBC WITH DIFFERENTIAL/PLATELET
Basophils Absolute: 0 10*3/uL (ref 0.0–0.1)
Basophils Relative: 1 % (ref 0–1)
HCT: 41.4 % (ref 39.0–52.0)
Lymphocytes Relative: 36 % (ref 12–46)
MCHC: 36.2 g/dL — ABNORMAL HIGH (ref 30.0–36.0)
Monocytes Absolute: 0.9 10*3/uL (ref 0.1–1.0)
Neutro Abs: 4 10*3/uL (ref 1.7–7.7)
Platelets: 345 10*3/uL (ref 150–400)
RDW: 13.4 % (ref 11.5–15.5)
WBC: 8.3 10*3/uL (ref 4.0–10.5)

## 2013-08-13 LAB — URINALYSIS, ROUTINE W REFLEX MICROSCOPIC
Bilirubin Urine: NEGATIVE
Glucose, UA: NEGATIVE mg/dL
Hgb urine dipstick: NEGATIVE
Specific Gravity, Urine: 1.009 (ref 1.005–1.030)

## 2013-08-13 LAB — COMPREHENSIVE METABOLIC PANEL
ALT: 19 U/L (ref 0–53)
AST: 13 U/L (ref 0–37)
Albumin: 3.4 g/dL — ABNORMAL LOW (ref 3.5–5.2)
Calcium: 9.1 mg/dL (ref 8.4–10.5)
Chloride: 98 mEq/L (ref 96–112)
Creatinine, Ser: 0.85 mg/dL (ref 0.50–1.35)
Sodium: 136 mEq/L (ref 135–145)

## 2013-08-13 LAB — LIPASE, BLOOD: Lipase: 41 U/L (ref 11–59)

## 2013-08-13 MED ORDER — IOHEXOL 300 MG/ML  SOLN
100.0000 mL | Freq: Once | INTRAMUSCULAR | Status: AC | PRN
  Administered 2013-08-13: 100 mL via INTRAVENOUS

## 2013-08-13 MED ORDER — OXYCODONE-ACETAMINOPHEN 5-325 MG PO TABS: 1.0000 | ORAL_TABLET | ORAL | Status: DC | PRN

## 2013-08-13 MED ORDER — KETOROLAC TROMETHAMINE 60 MG/2ML IM SOLN
60.0000 mg | Freq: Once | INTRAMUSCULAR | Status: AC
  Administered 2013-08-13: 60 mg via INTRAMUSCULAR
  Filled 2013-08-13: qty 2

## 2013-08-13 MED ORDER — OXYCODONE-ACETAMINOPHEN 5-325 MG PO TABS
1.0000 | ORAL_TABLET | Freq: Once | ORAL | Status: AC
  Administered 2013-08-13: 1 via ORAL
  Filled 2013-08-13: qty 1

## 2013-08-13 NOTE — ED Notes (Signed)
Pt c/o right sided abd pain into flank; pt sts recently treated for strep throat; pt sts pain in abd with inspiration and feels bloated

## 2013-08-13 NOTE — ED Provider Notes (Signed)
Pt received from [facility] at shift change.  Pt with RUQ abdominal pain and bloating pain x 2 days.  Currently being treated for strep throat.  Pt does do a lot of heavy lifting on a daily basis for work. PCP has evaluated pt and believes sx due to muscle spasms/cramps.  Pt is not satisfied with this reasoning and would like further work-up.  Labs unremarkable.  Plan:  CT abd/pelvis.  If negative, d/c home.  Results for orders placed during the hospital encounter of 08/13/13  CBC WITH DIFFERENTIAL      Result Value Range   WBC 8.3  4.0 - 10.5 K/uL   RBC 4.92  4.22 - 5.81 MIL/uL   Hemoglobin 15.0  13.0 - 17.0 g/dL   HCT 16.1  09.6 - 04.5 %   MCV 84.1  78.0 - 100.0 fL   MCH 30.5  26.0 - 34.0 pg   MCHC 36.2 (*) 30.0 - 36.0 g/dL   RDW 40.9  81.1 - 91.4 %   Platelets 345  150 - 400 K/uL   Neutrophils Relative % 47  43 - 77 %   Neutro Abs 4.0  1.7 - 7.7 K/uL   Lymphocytes Relative 36  12 - 46 %   Lymphs Abs 3.0  0.7 - 4.0 K/uL   Monocytes Relative 11  3 - 12 %   Monocytes Absolute 0.9  0.1 - 1.0 K/uL   Eosinophils Relative 5  0 - 5 %   Eosinophils Absolute 0.4  0.0 - 0.7 K/uL   Basophils Relative 1  0 - 1 %   Basophils Absolute 0.0  0.0 - 0.1 K/uL  COMPREHENSIVE METABOLIC PANEL      Result Value Range   Sodium 136  135 - 145 mEq/L   Potassium 3.8  3.5 - 5.1 mEq/L   Chloride 98  96 - 112 mEq/L   CO2 26  19 - 32 mEq/L   Glucose, Bld 93  70 - 99 mg/dL   BUN 10  6 - 23 mg/dL   Creatinine, Ser 7.82  0.50 - 1.35 mg/dL   Calcium 9.1  8.4 - 95.6 mg/dL   Total Protein 8.1  6.0 - 8.3 g/dL   Albumin 3.4 (*) 3.5 - 5.2 g/dL   AST 13  0 - 37 U/L   ALT 19  0 - 53 U/L   Alkaline Phosphatase 84  39 - 117 U/L   Total Bilirubin 0.1 (*) 0.3 - 1.2 mg/dL   GFR calc non Af Amer >90  >90 mL/min   GFR calc Af Amer >90  >90 mL/min  URINALYSIS, ROUTINE W REFLEX MICROSCOPIC      Result Value Range   Color, Urine YELLOW  YELLOW   APPearance CLEAR  CLEAR   Specific Gravity, Urine 1.009  1.005 - 1.030    pH 5.0  5.0 - 8.0   Glucose, UA NEGATIVE  NEGATIVE mg/dL   Hgb urine dipstick NEGATIVE  NEGATIVE   Bilirubin Urine NEGATIVE  NEGATIVE   Ketones, ur NEGATIVE  NEGATIVE mg/dL   Protein, ur NEGATIVE  NEGATIVE mg/dL   Urobilinogen, UA 0.2  0.0 - 1.0 mg/dL   Nitrite NEGATIVE  NEGATIVE   Leukocytes, UA NEGATIVE  NEGATIVE  LIPASE, BLOOD      Result Value Range   Lipase 41  11 - 59 U/L   Dg Chest 2 View  08/13/2013   CLINICAL DATA:  Chest pain. Abdominal pain.  EXAM: CHEST  2 VIEW  COMPARISON:  11/10/2011.  FINDINGS: Cardiopericardial silhouette is within normal limits. There is no airspace disease or pleural effusion. Mediastinal contours are normal. No free air underneath the hemidiaphragms.  IMPRESSION: No active cardiopulmonary disease.   Electronically Signed   By: Andreas Newport M.D.   On: 08/13/2013 14:51   Ct Abdomen Pelvis W Contrast  08/13/2013   CLINICAL DATA:  Right-sided abdominal pain radiating into the right flank.  EXAM: CT ABDOMEN AND PELVIS WITH CONTRAST  TECHNIQUE: Multidetector CT imaging of the abdomen and pelvis was performed using the standard protocol following bolus administration of intravenous contrast.  CONTRAST:  OMNIPAQUE IOHEXOL 300 MG/ML  SOLN  COMPARISON:  11/03/2012.  FINDINGS: Lung Bases: Dependent atelectasis.  Liver:  Normal.  Spleen:  Normal.  Gallbladder:  Normal.  Common bile duct:  Normal.  Pancreas:  Normal.  Adrenal glands:  Normal.  Kidneys: There is patchy decreased enhancement in the medulla. On delayed images, this extends to some regions of cortex. The findings are most compatible with pyelonephritis. Good uptake of contrast from the kidneys and normal excretion of contrast is present. Tiny low-density lesion is present in the right interpolar kidney, likely representing a cyst.  Stomach:  Collapsed.  Small bowel: Normal. No inflammatory changes or obstruction. No intra-abdominal free air or mesenteric adenopathy.  Colon:   Normal appendix.  Distal  colon decompressed.  Pelvic Genitourinary: Marked prostatomegaly, measuring 6 cm transverse. Enlargement of the seminal vesicles is present. The bladder is partially decompressed with mural thickening. There is no gas in the urinary bladder.  Bones: Lumbosacral transitional anatomy. No acute osseous abnormality.  Vasculature: Normal.  Body Wall: Normal.  IMPRESSION: Patchy abnormal enhancement in the kidneys, with a pattern that is most suggestive of pyelonephritis. This is unusual in a male. Interstitial nephritis is in the differential considerations and correlation with renal function is recommended. There is no convincing renal vascular disease. Renal infarction is in the differential considerations although the pattern would be atypical. Vasculitis also merits consideration.   Electronically Signed   By: Andreas Newport M.D.   On: 08/13/2013 19:33    7:53 PM Spoke with pt-- continues to have right flank pain.  Did recently test positive for strep 1 week ago and had fevers at that time.  Was started on abx but elected to stop taking them because he thought the medication was causing his sx.  Denies any urinary sx.  Given CT results will consult nephrology.  Discussed case with Dr. Juel Burrow-- given pts urine is free of infection or blood and preserved renal function, CT's suggested findings are unlikely.  Pt did recently have strep, again u/a without hematuria so post-strep glomerulonephritis also unlikely. Dr. Juel Burrow recommends drawing ASO and having pt FU with PCP in 1 week for repeat u/a.  Rx percocet for pain control.  Discussed lab/imaging results and plan with pt, he acknowledged understanding and agreed.  Return precautions advised.  Garlon Hatchet, PA-C 08/13/13 2315

## 2013-08-13 NOTE — ED Provider Notes (Signed)
CSN: [id number]     Arrival date & time 08/13/13  1155 History   First MD Initiated Contact with Patient 08/13/13 1248     Chief Complaint  Patient presents with  . Abdominal Pain   (Consider location/radiation/quality/duration/timing/severity/associated sxs/prior Treatment) HPI Comments: Patient presents emergency department with chief complaints of right upper abdominal pain. Patient states the pain began approximately 2 days ago. Reports recently being treated for strep throat. States the pain is worsened with deep inspiration. He states he feels bloated. States the pain waxes and wanes in severity. It can be moderate to severe. He has not tried taking anything to alleviate his symptoms. He denies fevers, chills, nausea, vomiting, diarrhea, constipation, or dysuria. She denies any other symptoms at this time.  The history is provided by the patient. No language interpreter was used.    Past Medical History  Diagnosis Date  . GERD (gastroesophageal reflux disease)    History reviewed. No pertinent past surgical history. Family History  Problem Relation Age of Onset  . Hypertension Mother   . Diabetes type II Mother   . Diabetes type II Father    History  Substance Use Topics  . Smoking status: Never Smoker   . Smokeless tobacco: Never Used  . Alcohol Use: No    Review of Systems  All other systems reviewed and are negative.    Allergies  Review of patient's allergies indicates no known allergies.  Home Medications   Current Outpatient Rx  Name  Route  Sig  Dispense  Refill  . amoxicillin (AMOXIL) 875 MG tablet   Oral   Take 1 tablet (875 mg total) by mouth 2 (two) times daily.   20 tablet   0   . cyclobenzaprine (FLEXERIL) 10 MG tablet   Oral   Take 1 tablet (10 mg total) by mouth 3 (three) times daily as needed for muscle spasms.   30 tablet   0   . HYDROcodone-acetaminophen (NORCO/VICODIN) 5-325 MG per tablet   Oral   Take 1 tablet by mouth every 6 (six)  hours as needed. For pain         . ibuprofen (ADVIL,MOTRIN) 600 MG tablet   Oral   Take 1 tablet (600 mg total) by mouth every 6 (six) hours as needed for pain.   30 tablet   0   . ketorolac (TORADOL) 30 MG/ML injection   Intramuscular   Inject 1 mL (30 mg total) into the muscle once.   1 mL   0   . lisinopril (PRINIVIL,ZESTRIL) 5 MG tablet   Oral   Take 5 mg by mouth every evening.         Marland Kitchen oseltamivir (TAMIFLU) 75 MG capsule   Oral   Take 1 capsule (75 mg total) by mouth 2 (two) times daily.   10 capsule   0    BP 131/88  Pulse 98  Temp(Src) 98.6 F (37 C) (Oral)  Resp 20  Ht 5\' 8"  (1.727 m)  Wt 177 lb 8 oz (80.513 kg)  BMI 26.99 kg/m2  SpO2 100% Physical Exam  Nursing note and vitals reviewed. Constitutional: He is oriented to person, place, and time. He appears well-developed and well-nourished.  HENT:  Head: Normocephalic and atraumatic.  Right Ear: External ear normal.  Left Ear: External ear normal.  Nose: Nose normal.  Mouth/Throat: Oropharynx is clear and moist. No oropharyngeal exudate.  Eyes: Conjunctivae and EOM are normal. Pupils are equal, round, and reactive to light. Right  eye exhibits no discharge. Left eye exhibits no discharge. No scleral icterus.  Neck: Normal range of motion. Neck supple. No JVD present.  Cardiovascular: Normal rate, regular rhythm, normal heart sounds and intact distal pulses.  Exam reveals no gallop and no friction rub.   No murmur heard. Pulmonary/Chest: Effort normal and breath sounds normal. No respiratory distress. He has no wheezes. He has no rales. He exhibits no tenderness.  Abdominal: Soft. Bowel sounds are normal. He exhibits no distension and no mass. There is tenderness. There is no rebound and no guarding.  Right upper quadrant abdominal pain, no other focal abdominal tenderness  Musculoskeletal: Normal range of motion. He exhibits no edema and no tenderness.  Neurological: He is alert and oriented to person,  place, and time. He has normal reflexes.  CN 3-12 intact  Skin: Skin is warm and dry.  Psychiatric: He has a normal mood and affect. His behavior is normal. Judgment and thought content normal.    ED Course  Procedures (including critical care time) Results for orders placed during the hospital encounter of 08/13/13  CBC WITH DIFFERENTIAL      Result Value Range   WBC 8.3  4.0 - 10.5 K/uL   RBC 4.92  4.22 - 5.81 MIL/uL   Hemoglobin 15.0  13.0 - 17.0 g/dL   HCT 16.1  09.6 - 04.5 %   MCV 84.1  78.0 - 100.0 fL   MCH 30.5  26.0 - 34.0 pg   MCHC 36.2 (*) 30.0 - 36.0 g/dL   RDW 40.9  81.1 - 91.4 %   Platelets 345  150 - 400 K/uL   Neutrophils Relative % 47  43 - 77 %   Neutro Abs 4.0  1.7 - 7.7 K/uL   Lymphocytes Relative 36  12 - 46 %   Lymphs Abs 3.0  0.7 - 4.0 K/uL   Monocytes Relative 11  3 - 12 %   Monocytes Absolute 0.9  0.1 - 1.0 K/uL   Eosinophils Relative 5  0 - 5 %   Eosinophils Absolute 0.4  0.0 - 0.7 K/uL   Basophils Relative 1  0 - 1 %   Basophils Absolute 0.0  0.0 - 0.1 K/uL  COMPREHENSIVE METABOLIC PANEL      Result Value Range   Sodium 136  135 - 145 mEq/L   Potassium 3.8  3.5 - 5.1 mEq/L   Chloride 98  96 - 112 mEq/L   CO2 26  19 - 32 mEq/L   Glucose, Bld 93  70 - 99 mg/dL   BUN 10  6 - 23 mg/dL   Creatinine, Ser 7.82  0.50 - 1.35 mg/dL   Calcium 9.1  8.4 - 95.6 mg/dL   Total Protein 8.1  6.0 - 8.3 g/dL   Albumin 3.4 (*) 3.5 - 5.2 g/dL   AST 13  0 - 37 U/L   ALT 19  0 - 53 U/L   Alkaline Phosphatase 84  39 - 117 U/L   Total Bilirubin 0.1 (*) 0.3 - 1.2 mg/dL   GFR calc non Af Amer >90  >90 mL/min   GFR calc Af Amer >90  >90 mL/min  URINALYSIS, ROUTINE W REFLEX MICROSCOPIC      Result Value Range   Color, Urine YELLOW  YELLOW   APPearance CLEAR  CLEAR   Specific Gravity, Urine 1.009  1.005 - 1.030   pH 5.0  5.0 - 8.0   Glucose, UA NEGATIVE  NEGATIVE mg/dL  Hgb urine dipstick NEGATIVE  NEGATIVE   Bilirubin Urine NEGATIVE  NEGATIVE   Ketones, ur  NEGATIVE  NEGATIVE mg/dL   Protein, ur NEGATIVE  NEGATIVE mg/dL   Urobilinogen, UA 0.2  0.0 - 1.0 mg/dL   Nitrite NEGATIVE  NEGATIVE   Leukocytes, UA NEGATIVE  NEGATIVE  LIPASE, BLOOD      Result Value Range   Lipase 41  11 - 59 U/L   Dg Chest 2 View  08/13/2013   CLINICAL DATA:  Chest pain. Abdominal pain.  EXAM: CHEST  2 VIEW  COMPARISON:  11/10/2011.  FINDINGS: Cardiopericardial silhouette is within normal limits. There is no airspace disease or pleural effusion. Mediastinal contours are normal. No free air underneath the hemidiaphragms.  IMPRESSION: No active cardiopulmonary disease.   Electronically Signed   By: Andreas Newport M.D.   On: 08/13/2013 14:51      EKG Interpretation   None       MDM  No diagnosis found.  Patient with back pain and side pain. Will check basic labs.  Urinalysis unremarkable, no hematuria, doubt kidney stone post streptococcal glomerulonephritis, or UTI. PERC negative. Symptoms are reproducible with palpation and with movement. I suspect the patient's symptoms are likely musculoskeletal. 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 He states that he frequently has to load heavy boxes. He was seen several days ago by family practice, and was diagnosed with muscle spasm. He was also advised to go to [facility] imaging and have a chest x-ray performed. He did not followup with these instructions, therefore I will order a chest x-ray here. If negative, will discharge the patient to home.  3:26 PM Patient reevaluated, now complaining of right lower quadrant tenderness.  Discussed with Dr. Deretha Emory, who recommends CT abd.  Patient signed out to Rickey Barbara, who will continue care.  Plan: Follow-up on CT.  Anticipate discharge to home.    Roxy Horseman, PA-C 08/13/13 1546

## 2013-08-13 NOTE — ED Notes (Signed)
Diet tray ordered 

## 2013-08-14 NOTE — ED Provider Notes (Signed)
Medical screening examination/treatment/procedure(s) were performed by non-physician practitioner and as supervising physician I was immediately available for consultation/collaboration.  EKG Interpretation   None         Audree Camel, MD 08/14/13 2326

## 2013-08-15 ENCOUNTER — Encounter: Payer: Self-pay | Admitting: Family Medicine

## 2013-08-15 LAB — ANTISTREPTOLYSIN O TITER: ASO: 404 IU/mL (ref <409)

## 2013-08-15 NOTE — ED Provider Notes (Signed)
Medical screening examination/treatment/procedure(s) were performed by non-physician practitioner and as supervising physician I was immediately available for consultation/collaboration.  EKG Interpretation   None         Shelda Jakes, MD 08/15/13 [phone]

## 2013-08-18 ENCOUNTER — Other Ambulatory Visit: Payer: Self-pay | Admitting: Family Medicine

## 2013-08-24 NOTE — Telephone Encounter (Signed)
Refilled patient's lisinopril for 90 days. Patient will need to see me for follow-up since I have not seen him in clinic yet.

## 2013-08-25 NOTE — Telephone Encounter (Signed)
LVM for patien to call back 

## 2013-10-24 ENCOUNTER — Ambulatory Visit (INDEPENDENT_AMBULATORY_CARE_PROVIDER_SITE_OTHER): Payer: Medicaid Other | Admitting: Family Medicine

## 2013-10-24 ENCOUNTER — Encounter: Payer: Self-pay | Admitting: Family Medicine

## 2013-10-24 VITALS — BP 142/82 | HR 84 | Temp 98.7°F | Ht 68.0 in | Wt 177.0 lb

## 2013-10-24 DIAGNOSIS — R200 Anesthesia of skin: Secondary | ICD-10-CM | POA: Insufficient documentation

## 2013-10-24 DIAGNOSIS — I10 Essential (primary) hypertension: Secondary | ICD-10-CM

## 2013-10-24 DIAGNOSIS — Z09 Encounter for follow-up examination after completed treatment for conditions other than malignant neoplasm: Secondary | ICD-10-CM

## 2013-10-24 DIAGNOSIS — R202 Paresthesia of skin: Secondary | ICD-10-CM

## 2013-10-24 DIAGNOSIS — M25569 Pain in unspecified knee: Secondary | ICD-10-CM

## 2013-10-24 DIAGNOSIS — R209 Unspecified disturbances of skin sensation: Secondary | ICD-10-CM

## 2013-10-24 DIAGNOSIS — M25561 Pain in right knee: Secondary | ICD-10-CM

## 2013-10-24 HISTORY — DX: Anesthesia of skin: R20.0

## 2013-10-24 HISTORY — DX: Paresthesia of skin: R20.2

## 2013-10-24 MED ORDER — LISINOPRIL 5 MG PO TABS: 5.0000 mg | ORAL_TABLET | Freq: Every day | ORAL | Status: DC

## 2013-10-24 NOTE — Patient Instructions (Signed)
Gerald Parker, it was a pleasure seeing you today. Today we talked about your medication refill for blood pressure, foot pain and knee pain. We also followed up on your recent ED department.   1. Blood pressure: your blood pressure was a little high today. I encourage you to continue working on keeping your blood pressure down by taking your medication and keeping a low salt, healthy diet. 2. Regarding your foot pain, please continue taking ibuprofen. 3. Regarding your knee pain, I will prescribe you a brace.  If you have any questions or concerns, please do not hesitate to call the office at [phone].  Sincerely,  Jacquelin Hawkingalph Taimur Fier, MD

## 2013-10-24 NOTE — Assessment & Plan Note (Signed)
Blood pressure slightly above goal. Will continue current regimen and follow-up at next visit.

## 2013-10-24 NOTE — Progress Notes (Signed)
   Subjective:    Patient ID: Gerald Parker, male    DOB: 12/27/1970, 43 y.o.   MRN: [id number]  HPI  Foot numbness Mr. Gerald Parker has had a two year history of tingling on the metatarsophalangeal joints of both his feet. This is intermittent and occurs between weeks to months and is made worse when he is working and improved with ibuprofen. There is no radiation and no recent trauma.  Right knee pain Recently saw Dr. [facility] for knee pain. He states that he received a steroid injection last week because of pain he has been having and a knee brace. However, this knee brace is very cumbersome and he would like a different brace to use that does not impede his mobility.  Hypertension He is compliant with lisinopril and has no side effects. He has no headaches or chest pain.   Review of Systems     Objective:   Physical Exam  Constitutional: He appears well-developed and well-nourished.  Cardiovascular: Normal rate and regular rhythm.   Musculoskeletal:       Right knee: He exhibits normal range of motion, no swelling and no deformity.       Right foot: He exhibits normal range of motion, no tenderness, no bony tenderness, no swelling and no deformity.       Left foot: He exhibits normal range of motion, no tenderness, no bony tenderness, no swelling and no deformity.  Neurological: No sensory deficit (good sensation of feet bilaterally).          Assessment & Plan:

## 2013-10-24 NOTE — Assessment & Plan Note (Signed)
Followed by [facility]. Wrote prescription for horseshoe brace.

## 2013-10-27 ENCOUNTER — Telehealth: Payer: Self-pay | Admitting: Family Medicine

## 2013-10-27 MED ORDER — IBUPROFEN 800 MG PO TABS: 800.0000 mg | ORAL_TABLET | Freq: Three times a day (TID) | ORAL | Status: DC | PRN

## 2013-10-27 NOTE — Telephone Encounter (Signed)
Pt is calling because he said that Dr. Mal MistyNetty was going to send a prescription of ibuprofen into his pharmacy. He picked up his BP medication but the ibuprofen was not there. jw

## 2013-10-28 NOTE — Telephone Encounter (Signed)
Patient was informed,he voiced great appreciation. Dejah Droessler, Virgel BouquetGiovanna S

## 2013-12-10 ENCOUNTER — Other Ambulatory Visit: Payer: Self-pay | Admitting: Family Medicine

## 2013-12-18 ENCOUNTER — Ambulatory Visit (INDEPENDENT_AMBULATORY_CARE_PROVIDER_SITE_OTHER): Payer: Medicaid Other | Admitting: Family Medicine

## 2013-12-18 ENCOUNTER — Telehealth: Payer: Self-pay | Admitting: Family Medicine

## 2013-12-18 ENCOUNTER — Encounter: Payer: Self-pay | Admitting: Family Medicine

## 2013-12-18 VITALS — BP 122/79 | HR 90 | Temp 98.1°F | Wt 175.8 lb

## 2013-12-18 DIAGNOSIS — I10 Essential (primary) hypertension: Secondary | ICD-10-CM

## 2013-12-18 DIAGNOSIS — R300 Dysuria: Secondary | ICD-10-CM

## 2013-12-18 DIAGNOSIS — N509 Disorder of male genital organs, unspecified: Secondary | ICD-10-CM

## 2013-12-18 DIAGNOSIS — N5082 Scrotal pain: Secondary | ICD-10-CM

## 2013-12-18 LAB — POCT URINALYSIS DIPSTICK
BILIRUBIN UA: NEGATIVE
Glucose, UA: NEGATIVE
KETONES UA: NEGATIVE
Leukocytes, UA: NEGATIVE
Nitrite, UA: NEGATIVE
PH UA: 6
PROTEIN UA: NEGATIVE
RBC UA: NEGATIVE
SPEC GRAV UA: 1.02
Urobilinogen, UA: 0.2

## 2013-12-18 MED ORDER — IBUPROFEN 600 MG PO TABS: 600.0000 mg | ORAL_TABLET | Freq: Three times a day (TID) | ORAL | Status: DC | PRN

## 2013-12-18 MED ORDER — LISINOPRIL 5 MG PO TABS: ORAL_TABLET | ORAL | Status: DC

## 2013-12-18 NOTE — Telephone Encounter (Signed)
Pt was seen this morning. He has called [facility] to make his appt for ultrasound. He was told the referral has not been made. Please let the pt know when referral is made so he can make the appt

## 2013-12-18 NOTE — Patient Instructions (Signed)
Damen N Faubert, it was a pleasure seeing you today. Today we talked about your urinary symptoms. I am going to have you get an ultrasound of your scrotum, and then follow-up with Urology. Regarding your hypertension, your blood pressure was controlled today. Please continue your medication as prescribed. Otherwise, please see me in 3 months.   If you have any questions or concerns, please do not hesitate to call the office at [phone].  Sincerely,  Jacquelin Hawkingalph Tavaughn Silguero, MD

## 2013-12-18 NOTE — Progress Notes (Signed)
   Subjective:    Patient ID: Gerald Parker, male    DOB: 07/13/1971, 43 y.o.   MRN: [id number]  HPI  Scrotal pain  Patient presents with 6 week history of scrotal burning worse in right than left. He also has associated left grown burning. No history of urgency, frequency, hesitancy or hematuria. Does have some leakage of urine with change in position. He has no fevers, no discharge. He has a history of this about 2 years ago with a similar presentation. He was diagnosed with a hydrocele and was followed by Urology. Was told he has an enlarged prostate as well, but is not on any medication. He has sex with his wife only and does not have a prior history of STI.   Hypertension  Patient here for follow-up of elevated blood pressure. He currently takes lisinopril and is adherent to regimen. Cardiac symptoms: none. Patient denies: chest pain, dyspnea and irregular heart beat. Cardiovascular risk factors: hypertension and male gender. Use of agents associated with hypertension: none. History of target organ damage: none.   Review of Systems     Objective:   BP 122/79  Pulse 90  Temp(Src) 98.1 F (36.7 C)  Wt 175 lb 12.8 oz (79.742 kg)  Physical Exam  Vitals reviewed. Constitutional: He appears well-developed and well-nourished.  Cardiovascular: Normal rate, regular rhythm and normal heart sounds.   No murmur heard. Abdominal: Hernia confirmed negative in the right inguinal area and confirmed negative in the left inguinal area.  Genitourinary: Penis normal. Right testis shows no mass and no tenderness. Left testis shows no mass. Circumcised.  Scrotal swelling present, greater on right than left. Could not illicit transillumination. Patient declined rectal exam  Lymphadenopathy:       Right: No inguinal adenopathy present.       Left: No inguinal adenopathy present.  Skin: Skin is warm, dry and intact. No abrasion and no rash noted.      Assessment & Plan:

## 2013-12-19 DIAGNOSIS — N5082 Scrotal pain: Secondary | ICD-10-CM | POA: Insufficient documentation

## 2013-12-19 HISTORY — DX: Scrotal pain: N50.82

## 2013-12-19 NOTE — Assessment & Plan Note (Signed)
Currently at goal. Will continue current regimen.

## 2013-12-24 ENCOUNTER — Ambulatory Visit (HOSPITAL_COMMUNITY)
Admission: RE | Admit: 2013-12-24 | Discharge: 2013-12-24 | Disposition: A | Discharge status: Home / Self Care | Payer: Medicaid Other | Source: Ambulatory Visit | Attending: Family Medicine | Admitting: Family Medicine

## 2013-12-24 DIAGNOSIS — N433 Hydrocele, unspecified: Secondary | ICD-10-CM | POA: Insufficient documentation

## 2013-12-24 DIAGNOSIS — R300 Dysuria: Secondary | ICD-10-CM

## 2013-12-26 ENCOUNTER — Telehealth: Payer: Self-pay | Admitting: Family Medicine

## 2013-12-26 NOTE — Telephone Encounter (Signed)
Would like results from ultrasound ° °

## 2013-12-26 NOTE — Telephone Encounter (Signed)
Also, patient request a referral to [facility] for bilateral knee pain. Has been seen there before but needs new referral.

## 2013-12-29 NOTE — Telephone Encounter (Signed)
Please advise.Thank you.Gerald Parker  

## 2013-12-29 NOTE — Telephone Encounter (Signed)
Pt called and needs to know what his lab results are and also that [facility] would like a copy he said. jw

## 2014-01-05 NOTE — Telephone Encounter (Signed)
Mr. Gerald Parker calling for the 4th time for results.. Please call h im back asap.  Patient doesn't understand why it's taking so long for this process.

## 2014-01-05 NOTE — Telephone Encounter (Signed)
Called patient and explained results. He voiced understanding. He is following up with Urology. He will be requesting records from our office and come to sign a release of information.

## 2014-06-18 ENCOUNTER — Emergency Department (HOSPITAL_COMMUNITY)
Admission: EM | Admit: 2014-06-18 | Discharge: 2014-06-18 | Disposition: A | Discharge status: Home / Self Care | Payer: Medicaid Other | Attending: Emergency Medicine | Admitting: Emergency Medicine

## 2014-06-18 ENCOUNTER — Encounter (HOSPITAL_COMMUNITY): Payer: Self-pay | Admitting: Emergency Medicine

## 2014-06-18 DIAGNOSIS — T1500XA Foreign body in cornea, unspecified eye, initial encounter: Secondary | ICD-10-CM | POA: Insufficient documentation

## 2014-06-18 DIAGNOSIS — S0502XA Injury of conjunctiva and corneal abrasion without foreign body, left eye, initial encounter: Secondary | ICD-10-CM

## 2014-06-18 DIAGNOSIS — Z79899 Other long term (current) drug therapy: Secondary | ICD-10-CM | POA: Insufficient documentation

## 2014-06-18 DIAGNOSIS — Y9289 Other specified places as the place of occurrence of the external cause: Secondary | ICD-10-CM | POA: Insufficient documentation

## 2014-06-18 DIAGNOSIS — Y9389 Activity, other specified: Secondary | ICD-10-CM | POA: Insufficient documentation

## 2014-06-18 DIAGNOSIS — Z8719 Personal history of other diseases of the digestive system: Secondary | ICD-10-CM | POA: Insufficient documentation

## 2014-06-18 DIAGNOSIS — S058X9A Other injuries of unspecified eye and orbit, initial encounter: Secondary | ICD-10-CM | POA: Insufficient documentation

## 2014-06-18 DIAGNOSIS — T1590XA Foreign body on external eye, part unspecified, unspecified eye, initial encounter: Secondary | ICD-10-CM | POA: Insufficient documentation

## 2014-06-18 MED ORDER — ERYTHROMYCIN 5 MG/GM OP OINT
1.0000 "application " | TOPICAL_OINTMENT | Freq: Once | OPHTHALMIC | Status: AC
  Administered 2014-06-18: 1 via OPHTHALMIC
  Filled 2014-06-18: qty 3.5

## 2014-06-18 MED ORDER — TETRACAINE HCL 0.5 % OP SOLN
1.0000 [drp] | Freq: Once | OPHTHALMIC | Status: AC
  Administered 2014-06-18: 1 [drp] via OPHTHALMIC
  Filled 2014-06-18: qty 2

## 2014-06-18 MED ORDER — FLUORESCEIN SODIUM 1 MG OP STRP
1.0000 | ORAL_STRIP | Freq: Once | OPHTHALMIC | Status: AC
  Administered 2014-06-18: 1 via OPHTHALMIC
  Filled 2014-06-18: qty 1

## 2014-06-18 NOTE — ED Provider Notes (Signed)
CSN: [id number]     Arrival date & time 06/18/14  1143 History  This chart was scribed for non-physician practitioner, Rhea Bleacher, PA-C working with Flint Melter, MD by Greggory Stallion, ED scribe. This patient was seen in room TR04C/TR04C and the patient's care was started at 12:35 PM.   Chief Complaint  Patient presents with  . 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     The history is provided by the patient. No language interpreter was used.   HPI Comments: Gerald Parker is a 43 y.o. male who presents to the Emergency Department complaining of a possible foreign body in his left eye. Pt was cutting and drilling aluminum yesterday and thinks he got a piece in his eye. Reports eye pain and watering. Pt has rinsed his eye out with water with no relief. Denies visual changes. Pt wears glasses but no contacts.   Past Medical History  Diagnosis Date  . GERD (gastroesophageal reflux disease)    History reviewed. No pertinent past surgical history. Family History  Problem Relation Age of Onset  . Hypertension Mother   . Diabetes type II Mother   . Diabetes type II Father    History  Substance Use Topics  . Smoking status: Never Smoker   . Smokeless tobacco: Never Used  . Alcohol Use: No    Review of Systems  Constitutional: Negative for fever.  HENT: Negative for congestion.   Eyes: Positive for pain and discharge. Negative for visual disturbance.  Respiratory: Negative for shortness of breath.   Cardiovascular: Negative for chest pain.  Gastrointestinal: Negative for abdominal distention.  Musculoskeletal: Negative for gait problem.  Skin: Negative for rash.  Neurological: Negative for speech difficulty.  Psychiatric/Behavioral: Negative for confusion.   Allergies  Review of patient's allergies indicates no known allergies.  Home Medications   Prior to Admission medications   Medication Sig  Start Date End Date Taking? Authorizing Provider  ibuprofen (ADVIL,MOTRIN) 600 MG tablet Take 1 tablet (600 mg total) by mouth every 8 (eight) hours as needed. 12/18/13   Jacquelin Hawking, MD  lisinopril (PRINIVIL,ZESTRIL) 5 MG tablet TAKE ONE TABLET BY MOUTH ONCE DAILY 12/18/13   Jacquelin Hawking, MD   BP 128/85  Pulse 82  Temp(Src) 98.8 F (37.1 C) (Oral)  Resp 16  SpO2 99%  Physical Exam  Nursing note and vitals reviewed. Constitutional: He is oriented to person, place, and time. He appears well-developed and well-nourished. No distress.  HENT:  Head: Normocephalic and atraumatic.  Eyes: EOM are normal. Pupils are equal, round, and reactive to light. Lids are everted and swept, no foreign bodies found. Left eye exhibits no chemosis, no discharge, no exudate and no hordeolum. No foreign body present in the left eye. Left conjunctiva is injected (mild). Left conjunctiva has no hemorrhage. Left eye exhibits normal extraocular motion and no nystagmus.  Slit lamp exam:      The left eye shows corneal abrasion and fluorescein uptake. The left eye shows no corneal flare, no corneal ulcer, no foreign body, no hyphema and no anterior chamber bulge.    Neck: Neck supple. No tracheal deviation present.  Cardiovascular: Normal rate.   Pulmonary/Chest: Effort normal. No respiratory distress.  Musculoskeletal: Normal range of motion.  Neurological: He is alert and oriented to person, place, and time.  Skin: Skin is warm and dry.  Psychiatric: He has a  normal mood and affect. His behavior is normal.    ED Course  Procedures (including critical care time)  DIAGNOSTIC STUDIES: Oxygen Saturation is 99% on RA, normal by my interpretation.    COORDINATION OF CARE: 12:39 PM-Discussed treatment plan which includes checking for corneal abrasions and ulcerations with pt at bedside and pt agreed to plan.   12:43 PM-Advised pt that he has a corneal abrasion. We will wash his eye out and discharged home with  antibiotic eye ointment.   Labs Review Labs Reviewed - No data to display  Imaging Review No results found.   EKG Interpretation None      Vital signs reviewed and are as follows: Filed Vitals:   06/18/14 1154  BP: 128/85  Pulse: 82  Temp: 98.8 F (37.1 C)  Resp: 16   1:04 PM Pt has relief of pain with tetracaine drops.   Eye irrigated. Pt counseled to see ophtho referral provided if no improvement in 2 days.   MDM   Final diagnoses:  Corneal abrasion, left, initial encounter   Corneal abrasion noted. Eye irrigated. No foreign bodies noted including with lid eversion. No surrounding erythema, swelling, vision changes/loss suspicious for orbital or periorbital cellulitis. No signs of iritis. No signs of glaucoma. No symptoms of retinal detachment. No ophthalmologic emergency suspected. Outpatient referral given in case of no improvement.    I personally performed the services described in this documentation, which was scribed in my presence. The recorded information has been reviewed and is accurate.  Renne Crigler, PA-C 06/18/14 [phone]

## 2014-06-18 NOTE — Discharge Instructions (Signed)
Please read and follow all provided instructions.  Your diagnoses today include:  1. Corneal abrasion, left, initial encounter    Tests performed today include:  Visual acuity testing to check your vision  Fluorescein dye examination to look for scratches on your eye  Vital signs. See below for your results today.   Medications prescribed:   Erythromycin  - antibiotic eye ointment  Use this medication as follows:  Apply 1/4" of the antibiotic ointment to affected eye up to 6 times a day while awake for 7 days  Take any prescribed medications only as directed.  Home care instructions:  Follow any educational materials contained in this packet.  You have a scratch of the eye on the cornea (the clear part of the eye). This condition may be caused by trauma. It is a common problem for people who wear contact lenses. Proper treatment is important. No evidence of infection is noted today, but you could develop an infection called a corneal ulcer or have some retained foreign body that may or may not have been noted today in the Emergency Department. Ulcers are not only painful, but they may also scar the cornea and cause permanent damage to the eye.   If you wear contact lenses, do not use them until your eye caregiver approves. Follow-up care is necessary to be sure the corneal abrasion is healing if not completely resolved in 2-3 days. See your caregiver or eye specialist as suggested for followup.   Follow-up instructions: Please follow-up with the opthalmologist listed in the next 2-3 days for further evaluation of your symptoms if not improved.    Return instructions:   Please return to the Emergency Department if you experience worsening symptoms.   Please return immediately if you develop severe pain, pus drainage, new change in vision, or fever.  Please return if you have any other emergent concerns.  Additional Information:  Your vital signs today were: BP 128/85    Pulse 82   Temp(Src) 98.8 F (37.1 C) (Oral)   Resp 16   SpO2 99% If your blood pressure (BP) was elevated above 135/85 this visit, please have this repeated by your doctor within one month.

## 2014-06-18 NOTE — ED Notes (Signed)
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  The patient says they cut aluminum in class and he thinks some went into his eye.  He rates his pain 8/10.

## 2014-06-18 NOTE — ED Provider Notes (Signed)
Medical screening examination/treatment/procedure(s) were performed by non-physician practitioner and as supervising physician I was immediately available for consultation/collaboration.   EKG Interpretation None       Flint Melter, MD 06/18/14 2001

## 2014-06-18 NOTE — ED Notes (Signed)
Pt reports "I feel like I have something in my eye", left. 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 States he does wear protective eye wear.

## 2014-07-17 ENCOUNTER — Other Ambulatory Visit: Payer: Self-pay | Admitting: *Deleted

## 2014-12-28 ENCOUNTER — Other Ambulatory Visit: Payer: Self-pay | Admitting: Family Medicine

## 2014-12-28 MED ORDER — LISINOPRIL 5 MG PO TABS: ORAL_TABLET | ORAL | Status: DC

## 2014-12-28 NOTE — Telephone Encounter (Signed)
Pt needs Lisinopril refilled at [facility] near [address] ([place]) please call pt when called in thanks / sr

## 2015-01-03 ENCOUNTER — Encounter (HOSPITAL_COMMUNITY): Payer: Self-pay | Admitting: *Deleted

## 2015-01-03 ENCOUNTER — Emergency Department (HOSPITAL_COMMUNITY)
Admission: EM | Admit: 2015-01-03 | Discharge: 2015-01-03 | Disposition: A | Discharge status: Home / Self Care | Payer: Medicaid Other | Attending: Emergency Medicine | Admitting: Emergency Medicine

## 2015-01-03 DIAGNOSIS — M791 Myalgia: Secondary | ICD-10-CM | POA: Diagnosis not present

## 2015-01-03 DIAGNOSIS — R51 Headache: Secondary | ICD-10-CM | POA: Insufficient documentation

## 2015-01-03 DIAGNOSIS — J069 Acute upper respiratory infection, unspecified: Secondary | ICD-10-CM | POA: Diagnosis not present

## 2015-01-03 DIAGNOSIS — R05 Cough: Secondary | ICD-10-CM | POA: Diagnosis present

## 2015-01-03 MED ORDER — ALBUTEROL SULFATE HFA 108 (90 BASE) MCG/ACT IN AERS
2.0000 | INHALATION_SPRAY | Freq: Once | RESPIRATORY_TRACT | Status: AC
  Administered 2015-01-03: 2 via RESPIRATORY_TRACT
  Filled 2015-01-03: qty 6.7

## 2015-01-03 MED ORDER — HYDROCOD POLST-CHLORPHEN POLST 10-8 MG/5ML PO LQCR: 5.0000 mL | Freq: Two times a day (BID) | ORAL | Status: DC | PRN

## 2015-01-03 MED ORDER — OXYMETAZOLINE HCL 0.05 % NA SOLN: 1.0000 | Freq: Two times a day (BID) | NASAL | Status: DC

## 2015-01-03 MED ORDER — KETOROLAC TROMETHAMINE 30 MG/ML IJ SOLN
30.0000 mg | Freq: Once | INTRAMUSCULAR | Status: AC
  Administered 2015-01-03: 30 mg via INTRAMUSCULAR
  Filled 2015-01-03: qty 1

## 2015-01-03 NOTE — ED Notes (Signed)
Declined W/C at D/C and was escorted to lobby by RN. 

## 2015-01-03 NOTE — ED Notes (Signed)
Pt reports a 1 week HX of URI with cough.

## 2015-01-03 NOTE — ED Provider Notes (Signed)
CSN: [id number]     Arrival date & time 01/03/15  [id number] History  This chart was scribed for non-physician practitioner, Trixie DredgeEmily Latarsha Zani, PA-C, working with Samuel JesterKathleen McManus, DO, by Modena JanskyAlbert Thayil, ED Scribe. This patient was seen in room TR08C/TR08C and the patient's care was started at 10:07 AM.  Chief Complaint  Patient presents with  . URI  . Cough   The history is provided by the patient. No language interpreter was used.   HPI Comments: Gerald Parker is a 44 y.o. male who presents to the Emergency Department complaining of moderate intermittent cough that started 4 days ago. He states that he has been having a cough for 4 days, that has worsened this morning. He reports that the cough started off dry, but is now productive of green mucous. He reports that he has SOB while coughing. He states that he has associated rhinorrhea, congestion, generalized weakness, and a frontal headache. He reports that he has been taking nyquil with very minimal relief. He states that he has many sick contacts with similar symptoms and did not have his flu shot this year. He reports a hx of HTN. He denies any sore throat.   Past Medical History  Diagnosis Date  . GERD (gastroesophageal reflux disease)    History reviewed. No pertinent past surgical history. Family History  Problem Relation Age of Onset  . Hypertension Mother   . Diabetes type II Mother   . Diabetes type II Father    History  Substance Use Topics  . Smoking status: Never Smoker   . Smokeless tobacco: Never Used  . Alcohol Use: No    Review of Systems  Constitutional: Negative for fever and chills.  HENT: Positive for congestion and rhinorrhea. Negative for sore throat.   Respiratory: Positive for cough and shortness of breath.   Cardiovascular: Negative for chest pain.  Gastrointestinal: Negative for nausea, vomiting and abdominal pain.  Musculoskeletal: Positive for myalgias (generalized ).  Skin: Negative for rash.   Allergic/Immunologic: Negative for immunocompromised state.  Neurological: Positive for headaches.  Hematological: Does not bruise/bleed easily.    Allergies  Review of patient's allergies indicates no known allergies.  Home Medications   Prior to Admission medications   Medication Sig Start Date End Date Taking? Authorizing Provider  ibuprofen (ADVIL,MOTRIN) 600 MG tablet Take 1 tablet (600 mg total) by mouth every 8 (eight) hours as needed. 12/18/13   Narda Bondsalph A Nettey, MD  lisinopril (PRINIVIL,ZESTRIL) 5 MG tablet TAKE ONE TABLET BY MOUTH ONCE DAILY 12/28/14   Narda Bondsalph A Nettey, MD   BP 129/90 mmHg  Pulse 99  Temp(Src) 98.8 F (37.1 C) (Oral)  Resp 18  Ht 5\' 8"  (1.727 m)  Wt 175 lb (79.379 kg)  BMI 26.61 kg/m2  SpO2 100% Physical Exam  Constitutional: He appears well-developed and well-nourished. No distress.  HENT:  Head: Normocephalic and atraumatic.  Mouth/Throat: Oropharynx is clear and moist. No oropharyngeal exudate.  Nasal mucosa has mild erythema and congestion.  TTP of bilateral frontal and maxillary sinuses. Serous fluid behind TM.   Eyes: Conjunctivae and EOM are normal. Right eye exhibits no discharge. Left eye exhibits no discharge.  Neck: Normal range of motion. Neck supple.  Mild cervical lymphadenopathy.   Cardiovascular: Regular rhythm.   Tachycardic.   Pulmonary/Chest: Effort normal and breath sounds normal. No stridor. No respiratory distress. He has no wheezes. He has no rales.  Lymphadenopathy:    He has cervical adenopathy.  Neurological: He is alert.  Skin:  He is not diaphoretic.  Nursing note and vitals reviewed.   ED Course  Procedures (including critical care time) DIAGNOSTIC STUDIES: Oxygen Saturation is 100% on RA, normal by my interpretation.    COORDINATION OF CARE: 10:11 AM- Pt advised of plan for treatment which includes medication and pt agrees.  Labs Review Labs Reviewed - No data to display  Imaging Review No results found.    EKG Interpretation None      MDM   Final diagnoses:  URI (upper respiratory infection)    Afebrile, nontoxic patient with constellation of symptoms suggestive of viral syndrome.  No concerning findings on exam.  Discharged home with supportive care, PCP follow up.  Discussed result, findings, treatment, and follow up  with patient.  Pt given return precautions.  Pt verbalizes understanding and agrees with plan.       I personally performed the services described in this documentation, which was scribed in my presence. The recorded information has been reviewed and is accurate.    Trixie Dredge, PA-C 01/03/15 1237  Samuel Jester, DO 01/04/15 1329

## 2015-01-03 NOTE — Discharge Instructions (Signed)
Read the information below.  Use the prescribed medication as directed.  Please discuss all new medications with your pharmacist.  You may return to the Emergency Department at any time for worsening condition or any new symptoms that concern you.  If you develop high fevers that do not resolve with tylenol or ibuprofen, you have difficulty swallowing or breathing, or you are unable to tolerate fluids by mouth, return to the ER for a recheck.    ° ° °Upper Respiratory Infection, Adult °An upper respiratory infection (URI) is also sometimes known as the common cold. The upper respiratory tract includes the nose, sinuses, throat, trachea, and bronchi. Bronchi are the airways leading to the lungs. Most people improve within 1 week, but symptoms can last up to 2 weeks. A residual cough may last even longer.  °CAUSES °Many different viruses can infect the tissues lining the upper respiratory tract. The tissues become irritated and inflamed and often become very moist. Mucus production is also common. A cold is contagious. You can easily spread the virus to others by oral contact. This includes kissing, sharing a glass, coughing, or sneezing. Touching your mouth or nose and then touching a surface, which is then touched by another person, can also spread the virus. °SYMPTOMS  °Symptoms typically develop 1 to 3 days after you come in contact with a cold virus. Symptoms vary from person to person. They may include: °· Runny nose. °· Sneezing. °· Nasal congestion. °· Sinus irritation. °· Sore throat. °· Loss of voice (laryngitis). °· Cough. °· Fatigue. °· Muscle aches. °· Loss of appetite. °· Headache. °· Low-grade fever. °DIAGNOSIS  °You might diagnose your own cold based on familiar symptoms, since most people get a cold 2 to 3 times a year. Your caregiver can confirm this based on your exam. Most importantly, your caregiver can check that your symptoms are not due to another disease such as strep throat, sinusitis,  pneumonia, asthma, or epiglottitis. Blood tests, throat tests, and X-rays are not necessary to diagnose a common cold, but they may sometimes be helpful in excluding other more serious diseases. Your caregiver will decide if any further tests are required. °RISKS AND COMPLICATIONS  °You may be at risk for a more severe case of the common cold if you smoke cigarettes, have chronic heart disease (such as heart failure) or lung disease (such as asthma), or if you have a weakened immune system. The very young and very old are also at risk for more serious infections. Bacterial sinusitis, middle ear infections, and bacterial pneumonia can complicate the common cold. The common cold can worsen asthma and chronic obstructive pulmonary disease (COPD). Sometimes, these complications can require emergency medical care and may be life-threatening. °PREVENTION  °The best way to protect against getting a cold is to practice good hygiene. Avoid oral or hand contact with people with cold symptoms. Wash your hands often if contact occurs. There is no clear evidence that vitamin C, vitamin E, echinacea, or exercise reduces the chance of developing a cold. However, it is always recommended to get plenty of rest and practice good nutrition. °TREATMENT  °Treatment is directed at relieving symptoms. There is no cure. Antibiotics are not effective, because the infection is caused by a virus, not by bacteria. Treatment may include: °· Increased fluid intake. Sports drinks offer valuable electrolytes, sugars, and fluids. °· Breathing heated mist or steam (vaporizer or shower). °· Eating chicken soup or other clear broths, and maintaining good nutrition. °· Getting plenty   of rest. °· Using gargles or lozenges for comfort. °· Controlling fevers with ibuprofen or acetaminophen as directed by your caregiver. °· Increasing usage of your inhaler if you have asthma. °Zinc gel and zinc lozenges, taken in the first 24 hours of the common cold, can  shorten the duration and lessen the severity of symptoms. Pain medicines may help with fever, muscle aches, and throat pain. A variety of non-prescription medicines are available to treat congestion and runny nose. Your caregiver can make recommendations and may suggest nasal or lung inhalers for other symptoms.  °HOME CARE INSTRUCTIONS  °· Only take over-the-counter or prescription medicines for pain, discomfort, or fever as directed by your caregiver. °· Use a warm mist humidifier or inhale steam from a shower to increase air moisture. This may keep secretions moist and make it easier to breathe. °· Drink enough water and fluids to keep your urine clear or pale yellow. °· Rest as needed. °· Return to work when your temperature has returned to normal or as your caregiver advises. You may need to stay home longer to avoid infecting others. You can also use a face mask and careful hand washing to prevent spread of the virus. °SEEK MEDICAL CARE IF:  °· After the first few days, you feel you are getting worse rather than better. °· You need your caregiver's advice about medicines to control symptoms. °· You develop chills, worsening shortness of breath, or brown or red sputum. These may be signs of pneumonia. °· You develop yellow or brown nasal discharge or pain in the face, especially when you bend forward. These may be signs of sinusitis. °· You develop a fever, swollen neck glands, pain with swallowing, or white areas in the back of your throat. These may be signs of strep throat. °SEEK IMMEDIATE MEDICAL CARE IF:  °· You have a fever. °· You develop severe or persistent headache, ear pain, sinus pain, or chest pain. °· You develop wheezing, a prolonged cough, cough up blood, or have a change in your usual mucus (if you have chronic lung disease). °· You develop sore muscles or a stiff neck. °Document Released: 03/28/2001 Document Revised: 12/25/2011 Document Reviewed: 01/07/2014 °[employer]® Patient Information ©2015  [employer]. This information is not intended to replace advice given to you by your health care provider. Make sure you discuss any questions you have with your health care provider. ° °

## 2015-01-28 ENCOUNTER — Telehealth: Payer: Self-pay | Admitting: Family Medicine

## 2015-01-28 NOTE — Telephone Encounter (Signed)
Pt needs Lisinopril refilled at [facility] near [address] ([place]) please call pt when called in thanks / sr

## 2015-01-28 NOTE — Telephone Encounter (Signed)
Covering inbox for Dr. Caleb PoppNettey  Reviewed chart. Dr. Caleb PoppNettey has already sent in refill for Lisinopril 5mg  #90 tablets with 3 refills ( 1 year supply ), [facility] [place] on 12/28/14.  Attempted to call patient, no answer. Attempted to call [facility] to confirm rx, spoke with pharmacist who confirmed patient has already picked up Lisinopril in March 2016 (90 pills, 3 month supply and has active refills).  No further need for Lisinopril rx at this time.  Saralyn PilarAlexander Shawnell Dykes, DO [facility], PGY-2

## 2015-05-07 ENCOUNTER — Encounter (HOSPITAL_COMMUNITY): Payer: Self-pay | Admitting: *Deleted

## 2015-05-07 ENCOUNTER — Emergency Department (HOSPITAL_COMMUNITY)
Admission: EM | Admit: 2015-05-07 | Discharge: 2015-05-07 | Disposition: A | Discharge status: Home / Self Care | Payer: Medicaid Other | Attending: Emergency Medicine | Admitting: Emergency Medicine

## 2015-05-07 DIAGNOSIS — K219 Gastro-esophageal reflux disease without esophagitis: Secondary | ICD-10-CM | POA: Diagnosis not present

## 2015-05-07 DIAGNOSIS — R21 Rash and other nonspecific skin eruption: Secondary | ICD-10-CM | POA: Diagnosis present

## 2015-05-07 DIAGNOSIS — Z79899 Other long term (current) drug therapy: Secondary | ICD-10-CM | POA: Diagnosis not present

## 2015-05-07 DIAGNOSIS — L239 Allergic contact dermatitis, unspecified cause: Secondary | ICD-10-CM | POA: Diagnosis not present

## 2015-05-07 MED ORDER — CETIRIZINE HCL 10 MG PO TABS: 10.0000 mg | ORAL_TABLET | Freq: Every day | ORAL | Status: DC

## 2015-05-07 MED ORDER — FAMOTIDINE 20 MG PO TABS: 20.0000 mg | ORAL_TABLET | Freq: Two times a day (BID) | ORAL | Status: DC

## 2015-05-07 MED ORDER — PREDNISONE 10 MG PO TABS: ORAL_TABLET | ORAL | Status: DC

## 2015-05-07 MED ORDER — BETAMETHASONE SOD PHOS & ACET 6 (3-3) MG/ML IJ SUSP
6.0000 mg | Freq: Once | INTRAMUSCULAR | Status: AC
  Administered 2015-05-07: 6 mg via INTRAMUSCULAR
  Filled 2015-05-07 (×2): qty 1

## 2015-05-07 MED ORDER — HYDROXYZINE HCL 25 MG PO TABS: 25.0000 mg | ORAL_TABLET | Freq: Three times a day (TID) | ORAL | Status: DC | PRN

## 2015-05-07 NOTE — ED Provider Notes (Signed)
CSN: [id number]     Arrival date & time 05/07/15  1831 History  This chart was scribed for Kerrie Buffalo, NP working with Pricilla Loveless, MD by Evon Slack, ED Scribe. This patient was seen in room TR07C/TR07C and the patient's care was started at 6:45 PM.      Chief Complaint  Patient presents with  . Rash   Patient is a 44 y.o. male presenting with rash. The history is provided by the patient. No language interpreter was used.  Rash Location:  Torso, leg and shoulder/arm Shoulder/arm rash location:  R arm and L arm Torso rash location:  Lower back Leg rash location:  R upper leg and L upper leg Quality: itchiness   Severity:  Moderate Onset quality:  Gradual Duration:  6 days Timing:  Constant Chronicity:  New Context: not exposure to similar rash, not food, not new detergent/soap, not nuts and not plant contact   Relieved by:  Nothing Ineffective treatments: OTC medications. Associated symptoms: no shortness of breath and no sore throat    HPI Comments: Gerald Parker is a 44 y.o. male who presents to the Emergency Department complaining of diffuse itchy rash onset 6 days prior. Pt states the rash is located on his knees, upper legs, abdomen back and bilateral arms. Pt does report wearing a new shirt without washing it prior to washing it. Pt denies any new soaps, detergents, chemical exposures or change in diet.  Pt states that he has tried OTC allergy medication with no relief. Pt denies being in contact with anybody with similar rash. Pt denies sore throat, trouble swallowing or SOB.   Past Medical History  Diagnosis Date  . GERD (gastroesophageal reflux disease)    History reviewed. No pertinent past surgical history. Family History  Problem Relation Age of Onset  . Hypertension Mother   . Diabetes type II Mother   . Diabetes type II Father    History  Substance Use Topics  . Smoking status: Never Smoker   . Smokeless tobacco: Never Used  . Alcohol Use: No     Review of Systems  HENT: Negative for sore throat and trouble swallowing.   Respiratory: Negative for shortness of breath.   Skin: Positive for rash.  All other systems reviewed and are negative.    Allergies  Review of patient's allergies indicates no known allergies.  Home Medications   Prior to Admission medications   Medication Sig Start Date End Date Taking? Authorizing Provider  cetirizine (ZYRTEC) 10 MG tablet Take 1 tablet (10 mg total) by mouth daily. 05/07/15   Hope Orlene Och, NP  chlorpheniramine-HYDROcodone (TUSSIONEX PENNKINETIC ER) 10-8 MG/5ML LQCR Take 5 mLs by mouth every 12 (twelve) hours as needed for cough (and pain). 01/03/15   Trixie Dredge, PA-C  famotidine (PEPCID) 20 MG tablet Take 1 tablet (20 mg total) by mouth 2 (two) times daily. 05/07/15   Hope Orlene Och, NP  hydrOXYzine (ATARAX/VISTARIL) 25 MG tablet Take 1 tablet (25 mg total) by mouth every 8 (eight) hours as needed for itching. 05/07/15   Hope Orlene Och, NP  ibuprofen (ADVIL,MOTRIN) 600 MG tablet Take 1 tablet (600 mg total) by mouth every 8 (eight) hours as needed. 12/18/13   Narda Bonds, MD  lisinopril (PRINIVIL,ZESTRIL) 5 MG tablet TAKE ONE TABLET BY MOUTH ONCE DAILY 12/28/14   Narda Bonds, MD  oxymetazoline (AFRIN NASAL SPRAY) 0.05 % nasal spray Place 1 spray into both nostrils 2 (two) times daily. X 3 days  01/03/15   Trixie Dredge, PA-C  predniSONE (DELTASONE) 10 MG tablet Starting 05/08/15 take 6 tablets PO first day then 5, 4, 3, 2, 1 05/07/15   Hope M Neese, NP   BP 145/101 mmHg  Pulse 99  Temp(Src) 97.8 F (36.6 C) (Oral)  Resp 20  Wt 192 lb (87.091 kg)  SpO2 100%   Physical Exam  Constitutional: He is oriented to person, place, and time. He appears well-developed and well-nourished. No distress.  HENT:  Head: Normocephalic and atraumatic.  Mouth/Throat: Uvula is midline and oropharynx is clear and moist. No posterior oropharyngeal edema or posterior oropharyngeal erythema.  Eyes: Conjunctivae and  EOM are normal. Pupils are equal, round, and reactive to light.  Neck: Neck supple. No tracheal deviation present.  Cardiovascular: Normal rate, regular rhythm and normal heart sounds.   Pulmonary/Chest: Effort normal and breath sounds normal. No respiratory distress.  Musculoskeletal: Normal range of motion.  Neurological: He is alert and oriented to person, place, and time.  Skin: Skin is warm and dry. Rash noted.  Fine red rash to the abdomen, lower back, bilateral arms, posterior aspect of knees and inner aspect of thighs.   Psychiatric: He has a normal mood and affect. His behavior is normal.  Nursing note and vitals reviewed.   ED Course  Procedures (including critical care time) DIAGNOSTIC STUDIES: Oxygen Saturation is 98% on RA, normal by my interpretation.    COORDINATION OF CARE: 6:57 PM-Discussed treatment plan with pt at bedside and pt agreed to plan.  7:12 PM- Pt's BP is elevated but he states that he has not taken his BP medication today.   Patient request shot for his itching and rash. Celestone Soluspan 6 mg IM.  MDM  44 y.o. male with rash and itching x 5 days. Stable for d/c without shortness of breath or difficulty swallowing. Will treat for allergic reaction and he will follow up with his doctor at [facility]. He will return here as needed for any problems.   Final diagnoses:  Allergic dermatitis    I personally performed the services described in this documentation, which was scribed in my presence. The recorded information has been reviewed and is accurate.      [place], [place] 05/07/15 1919  Pricilla Loveless, MD 05/08/15 [phone]

## 2015-05-07 NOTE — Discharge Instructions (Signed)
Do not take the medication for itching if driving as it will make you sleepy.

## 2015-05-07 NOTE — ED Notes (Addendum)
Pt reports full body rash starting on Saturday. Pt took OTC allergy medication without relief. Pt denies any new soaps, shampoos, lotions, exposure to chemicals, changes in diet. Pt states he stayed in a [place] hotel for three days a week and a half ago.

## 2015-11-26 ENCOUNTER — Other Ambulatory Visit: Payer: Self-pay | Admitting: Family Medicine

## 2015-12-25 ENCOUNTER — Emergency Department (HOSPITAL_COMMUNITY): Payer: Medicaid Other

## 2015-12-25 ENCOUNTER — Encounter (HOSPITAL_COMMUNITY): Payer: Self-pay

## 2015-12-25 ENCOUNTER — Emergency Department (HOSPITAL_COMMUNITY)
Admission: EM | Admit: 2015-12-25 | Discharge: 2015-12-25 | Disposition: A | Discharge status: Home / Self Care | Payer: Medicaid Other | Attending: Emergency Medicine | Admitting: Emergency Medicine

## 2015-12-25 DIAGNOSIS — Z79899 Other long term (current) drug therapy: Secondary | ICD-10-CM | POA: Insufficient documentation

## 2015-12-25 DIAGNOSIS — I10 Essential (primary) hypertension: Secondary | ICD-10-CM | POA: Diagnosis not present

## 2015-12-25 DIAGNOSIS — Z7952 Long term (current) use of systemic steroids: Secondary | ICD-10-CM | POA: Insufficient documentation

## 2015-12-25 DIAGNOSIS — J069 Acute upper respiratory infection, unspecified: Secondary | ICD-10-CM | POA: Insufficient documentation

## 2015-12-25 DIAGNOSIS — R509 Fever, unspecified: Secondary | ICD-10-CM | POA: Diagnosis present

## 2015-12-25 DIAGNOSIS — K219 Gastro-esophageal reflux disease without esophagitis: Secondary | ICD-10-CM | POA: Diagnosis not present

## 2015-12-25 DIAGNOSIS — R000 Tachycardia, unspecified: Secondary | ICD-10-CM | POA: Diagnosis not present

## 2015-12-25 DIAGNOSIS — B9789 Other viral agents as the cause of diseases classified elsewhere: Secondary | ICD-10-CM

## 2015-12-25 MED ORDER — PREDNISONE 20 MG PO TABS: 40.0000 mg | ORAL_TABLET | Freq: Every day | ORAL | Status: DC

## 2015-12-25 MED ORDER — IBUPROFEN 600 MG PO TABS: 600.0000 mg | ORAL_TABLET | Freq: Four times a day (QID) | ORAL | Status: DC | PRN

## 2015-12-25 MED ORDER — KETOROLAC TROMETHAMINE 60 MG/2ML IM SOLN
60.0000 mg | Freq: Once | INTRAMUSCULAR | Status: AC
  Administered 2015-12-25: 60 mg via INTRAMUSCULAR
  Filled 2015-12-25: qty 2

## 2015-12-25 MED ORDER — BENZONATATE 100 MG PO CAPS: 100.0000 mg | ORAL_CAPSULE | Freq: Three times a day (TID) | ORAL | Status: DC

## 2015-12-25 NOTE — ED Notes (Signed)
Declined W/C at D/C and was escorted to lobby by RN. 

## 2015-12-25 NOTE — Discharge Instructions (Signed)
Take tylenol and motrin for fever and pain. Rest, drink plenty of fluids. Prednisone as prescribed until all gone for inflammation and cough. Tessalon for cough as needed. Follow up with primary care doctor.    Upper Respiratory Infection, Adult Most upper respiratory infections (URIs) are a viral infection of the air passages leading to the lungs. A URI affects the nose, throat, and upper air passages. The most common type of URI is nasopharyngitis and is typically referred to as "the common cold." URIs run their course and usually go away on their own. Most of the time, a URI does not require medical attention, but sometimes a bacterial infection in the upper airways can follow a viral infection. This is called a secondary infection. Sinus and middle ear infections are common types of secondary upper respiratory infections. Bacterial pneumonia can also complicate a URI. A URI can worsen asthma and chronic obstructive pulmonary disease (COPD). Sometimes, these complications can require emergency medical care and may be life threatening.  CAUSES Almost all URIs are caused by viruses. A virus is a type of germ and can spread from one person to another.  RISKS FACTORS You may be at risk for a URI if:   You smoke.   You have chronic heart or lung disease.  You have a weakened defense (immune) system.   You are very young or very old.   You have nasal allergies or asthma.  You work in crowded or poorly ventilated areas.  You work in health care facilities or schools. SIGNS AND SYMPTOMS  Symptoms typically develop 2-3 days after you come in contact with a cold virus. Most viral URIs last 7-10 days. However, viral URIs from the influenza virus (flu virus) can last 14-18 days and are typically more severe. Symptoms may include:   Runny or stuffy (congested) nose.   Sneezing.   Cough.   Sore throat.   Headache.   Fatigue.   Fever.   Loss of appetite.   Pain in your  forehead, behind your eyes, and over your cheekbones (sinus pain).  Muscle aches.  DIAGNOSIS  Your health care provider may diagnose a URI by:  Physical exam.  Tests to check that your symptoms are not due to another condition such as:  Strep throat.  Sinusitis.  Pneumonia.  Asthma. TREATMENT  A URI goes away on its own with time. It cannot be cured with medicines, but medicines may be prescribed or recommended to relieve symptoms. Medicines may help:  Reduce your fever.  Reduce your cough.  Relieve nasal congestion. HOME CARE INSTRUCTIONS   Take medicines only as directed by your health care provider.   Gargle warm saltwater or take cough drops to comfort your throat as directed by your health care provider.  Use a warm mist humidifier or inhale steam from a shower to increase air moisture. This may make it easier to breathe.  Drink enough fluid to keep your urine clear or pale yellow.   Eat soups and other clear broths and maintain good nutrition.   Rest as needed.   Return to work when your temperature has returned to normal or as your health care provider advises. You may need to stay home longer to avoid infecting others. You can also use a face mask and careful hand washing to prevent spread of the virus.  Increase the usage of your inhaler if you have asthma.   Do not use any tobacco products, including cigarettes, chewing tobacco, or electronic cigarettes. If  you need help quitting, ask your health care provider. PREVENTION  The best way to protect yourself from getting a cold is to practice good hygiene.   Avoid oral or hand contact with people with cold symptoms.   Wash your hands often if contact occurs.  There is no clear evidence that vitamin C, vitamin E, echinacea, or exercise reduces the chance of developing a cold. However, it is always recommended to get plenty of rest, exercise, and practice good nutrition.  SEEK MEDICAL CARE IF:   You  are getting worse rather than better.   Your symptoms are not controlled by medicine.   You have chills.  You have worsening shortness of breath.  You have brown or red mucus.  You have yellow or brown nasal discharge.  You have pain in your face, especially when you bend forward.  You have a fever.  You have swollen neck glands.  You have pain while swallowing.  You have white areas in the back of your throat. SEEK IMMEDIATE MEDICAL CARE IF:   You have severe or persistent:  Headache.  Ear pain.  Sinus pain.  Chest pain.  You have chronic lung disease and any of the following:  Wheezing.  Prolonged cough.  Coughing up blood.  A change in your usual mucus.  You have a stiff neck.  You have changes in your:  Vision.  Hearing.  Thinking.  Mood. MAKE SURE YOU:   Understand these instructions.  Will watch your condition.  Will get help right away if you are not doing well or get worse.   This information is not intended to replace advice given to you by your health care provider. Make sure you discuss any questions you have with your health care provider.   Document Released: 03/28/2001 Document Revised: 02/16/2015 Document Reviewed: 01/07/2014 Elsevier Interactive Patient Education [employer]2016 Elsevier Inc.

## 2015-12-25 NOTE — ED Notes (Addendum)
Pt. Having flu-lie sym,toms for 2 days. Cough and sore throat also reports having a fever last night 102.0 and took Ibuprofen.   Body aches present Skin is warm and dry.   Alert and oriented X4.  Pt. Placed in a mask in triage

## 2015-12-25 NOTE — ED Provider Notes (Signed)
CSN: [id number]     Arrival date & time 12/25/15  0815 History   First MD Initiated Contact with Patient 12/25/15 [phone]     Chief Complaint  Patient presents with  . Influenza     (Consider location/radiation/quality/duration/timing/severity/associated sxs/prior Treatment) HPI Gerald Parker is a 45 y.o. male with history of hypertension and acid reflux, presents to emergency department complaining of flulike symptoms. Patient states symptoms started 2 days ago. He reports nasal congestion, sore throat, hoarseness, cough, body aches, fever, chills. He states his symptoms are getting worse with everyday. He reports temperature 102 yesterday. He reports taking ibuprofen last night. He did not take any medications this morning. He denies any recent ill contacts. He denies any neck pain or stiffness, but does state he has a headache. No photophobia. He denies any nausea, vomiting, diarrhea, but states that he has no appetite. Nothing making his symptoms better or worse.   Past Medical History  Diagnosis Date  . GERD (gastroesophageal reflux disease)    History reviewed. No pertinent past surgical history. Family History  Problem Relation Age of Onset  . Hypertension Mother   . Diabetes type II Mother   . Diabetes type II Father    Social History  Substance Use Topics  . Smoking status: Never Smoker   . Smokeless tobacco: Never Used  . Alcohol Use: No    Review of Systems  Constitutional: Positive for fever and chills.  HENT: Positive for congestion and sore throat.   Respiratory: Positive for cough. Negative for chest tightness and shortness of breath.   Cardiovascular: Negative for chest pain, palpitations and leg swelling.  Gastrointestinal: Negative for nausea, vomiting, abdominal pain and diarrhea.  Musculoskeletal: Positive for myalgias. Negative for neck pain and neck stiffness.  Skin: Negative for rash.  Allergic/Immunologic: Negative for immunocompromised state.   Neurological: Positive for headaches. Negative for dizziness, weakness, light-headedness and numbness.  All other systems reviewed and are negative.     Allergies  Review of patient's allergies indicates no known allergies.  Home Medications   Prior to Admission medications   Medication Sig Start Date End Date Taking? Authorizing Provider  cetirizine (ZYRTEC) 10 MG tablet Take 1 tablet (10 mg total) by mouth daily. 05/07/15   Hope Orlene OchM Neese, NP  chlorpheniramine-HYDROcodone (TUSSIONEX PENNKINETIC ER) 10-8 MG/5ML LQCR Take 5 mLs by mouth every 12 (twelve) hours as needed for cough (and pain). 01/03/15   Trixie DredgeEmily West, PA-C  famotidine (PEPCID) 20 MG tablet Take 1 tablet (20 mg total) by mouth 2 (two) times daily. 05/07/15   Hope Orlene OchM Neese, NP  hydrOXYzine (ATARAX/VISTARIL) 25 MG tablet Take 1 tablet (25 mg total) by mouth every 8 (eight) hours as needed for itching. 05/07/15   Hope Orlene OchM Neese, NP  ibuprofen (ADVIL,MOTRIN) 600 MG tablet Take 1 tablet (600 mg total) by mouth every 8 (eight) hours as needed. 12/18/13   Narda Bondsalph A Nettey, MD  lisinopril (PRINIVIL,ZESTRIL) 5 MG tablet TAKE ONE TABLET BY MOUTH ONCE DAILY 11/28/15   Narda Bondsalph A Nettey, MD  oxymetazoline (AFRIN NASAL SPRAY) 0.05 % nasal spray Place 1 spray into both nostrils 2 (two) times daily. X 3 days 01/03/15   Trixie DredgeEmily West, PA-C  predniSONE (DELTASONE) 10 MG tablet Starting 05/08/15 take 6 tablets PO first day then 5, 4, 3, 2, 1 05/07/15   Hope Orlene OchM Neese, NP   BP 129/88 mmHg  Pulse 104  Temp(Src) 98.3 F (36.8 C) (Oral)  Resp 18  Ht 5\' 7"  (1.702 m)  Wt 87.998 kg  BMI 30.38 kg/m2  SpO2 99% Physical Exam  Constitutional: He is oriented to person, place, and time. He appears well-developed and well-nourished. No distress.  HENT:  Head: Normocephalic and atraumatic.  Right Ear: Tympanic membrane, external ear and ear canal normal.  Left Ear: Tympanic membrane, external ear and ear canal normal.  Nose: Nose normal.  Mouth/Throat: Uvula is midline,  oropharynx is clear and moist and mucous membranes are normal.  Eyes: Conjunctivae are normal.  Neck: Neck supple.  Cardiovascular: Normal rate, regular rhythm and normal heart sounds.   Pulmonary/Chest: Effort normal and breath sounds normal. No respiratory distress. He has no wheezes. He has no rales.  Abdominal: Soft. Bowel sounds are normal. He exhibits no distension. There is no tenderness. There is no rebound.  Musculoskeletal: He exhibits no edema.  Neurological: He is alert and oriented to person, place, and time.  Skin: Skin is warm and dry.  Nursing note and vitals reviewed.   ED Course  Procedures (including critical care time) Labs Review Labs Reviewed - No data to display  Imaging Review Dg Chest 2 View  12/25/2015  CLINICAL DATA:  Flu like symptoms.  Cough. EXAM: CHEST  2 VIEW COMPARISON:  August 13, 2013 FINDINGS: Sign rib IMPRESSION: No active cardiopulmonary disease. Electronically Signed   By: Gerome Sam III M.D   On: 12/25/2015 11:13   I have personally reviewed and evaluated these images and lab results as part of my medical decision-making.   EKG Interpretation None      MDM   Final diagnoses:  Viral URI with cough   Patient is here with flulike symptoms. He is mildly tachycardic of 104, afebrile at this time, normal blood pressure respiratory rate. Exam is unremarkable other than nasal congestion, and cough. Patient is very concerned about his cough. Will get chest x-ray to rule out pneumonia.  11:26 AM Chest x-ray is negative. Most likely viral URI possibly influenza, will treat symptomatically with Tylenol or Motrin for fever and pain. Tessalon for cough. Will place on prednisone burst. Follow-up with primary care doctor. Return precautions discussed.  Filed Vitals:   12/25/15 0821 12/25/15 0832  BP: 129/88   Pulse: 104   Temp: 98.3 F (36.8 C)   TempSrc: Oral   Resp: 18   Height:  (1.702 m)  (1.702 m)  Weight: 87.998 kg 87.998 kg   SpO2: 99%      Jaynie Crumble, PA-C 12/25/15 1127  Marily Memos, MD 12/26/15 [phone]

## 2016-01-27 ENCOUNTER — Emergency Department (HOSPITAL_COMMUNITY)
Admission: EM | Admit: 2016-01-27 | Discharge: 2016-01-27 | Disposition: A | Discharge status: Home / Self Care | Payer: Medicaid Other | Attending: Emergency Medicine | Admitting: Emergency Medicine

## 2016-01-27 ENCOUNTER — Emergency Department (HOSPITAL_COMMUNITY): Payer: Medicaid Other

## 2016-01-27 ENCOUNTER — Encounter (HOSPITAL_COMMUNITY): Payer: Self-pay

## 2016-01-27 DIAGNOSIS — Z79899 Other long term (current) drug therapy: Secondary | ICD-10-CM | POA: Diagnosis not present

## 2016-01-27 DIAGNOSIS — Z7952 Long term (current) use of systemic steroids: Secondary | ICD-10-CM | POA: Diagnosis not present

## 2016-01-27 DIAGNOSIS — K219 Gastro-esophageal reflux disease without esophagitis: Secondary | ICD-10-CM | POA: Diagnosis not present

## 2016-01-27 DIAGNOSIS — J111 Influenza due to unidentified influenza virus with other respiratory manifestations: Secondary | ICD-10-CM | POA: Insufficient documentation

## 2016-01-27 DIAGNOSIS — R000 Tachycardia, unspecified: Secondary | ICD-10-CM | POA: Insufficient documentation

## 2016-01-27 DIAGNOSIS — R05 Cough: Secondary | ICD-10-CM | POA: Diagnosis present

## 2016-01-27 LAB — URINALYSIS, ROUTINE W REFLEX MICROSCOPIC
Bilirubin Urine: NEGATIVE
Glucose, UA: NEGATIVE mg/dL
Hgb urine dipstick: NEGATIVE
Ketones, ur: NEGATIVE mg/dL
Leukocytes, UA: NEGATIVE
Nitrite: NEGATIVE
Protein, ur: NEGATIVE mg/dL
Specific Gravity, Urine: 1.008 (ref 1.005–1.030)
pH: 7 (ref 5.0–8.0)

## 2016-01-27 MED ORDER — ACETAMINOPHEN 325 MG PO TABS
650.0000 mg | ORAL_TABLET | Freq: Once | ORAL | Status: AC
  Administered 2016-01-27: 650 mg via ORAL

## 2016-01-27 MED ORDER — KETOROLAC TROMETHAMINE 15 MG/ML IJ SOLN
30.0000 mg | Freq: Once | INTRAMUSCULAR | Status: AC
  Administered 2016-01-27: 30 mg via INTRAMUSCULAR
  Filled 2016-01-27: qty 2

## 2016-01-27 MED ORDER — HYDROCOD POLST-CHLORPHEN POLST 10-8 MG/5ML PO LQCR: 5.0000 mL | Freq: Two times a day (BID) | ORAL | Status: DC | PRN

## 2016-01-27 MED ORDER — ACETAMINOPHEN 325 MG PO TABS
ORAL_TABLET | ORAL | Status: AC
  Filled 2016-01-27: qty 2

## 2016-01-27 NOTE — Discharge Instructions (Signed)

## 2016-01-27 NOTE — ED Notes (Signed)
Patient here with cough and congestion, fever and generalized body aches x 2 days

## 2016-01-27 NOTE — ED Provider Notes (Signed)
CSN: [id number]     Arrival date & time 01/27/16  4098 History   First MD Initiated Contact with Patient 01/27/16 [phone]     Chief Complaint  Patient presents with  . Cough     (Consider location/radiation/quality/duration/timing/severity/associated sxs/prior Treatment) Patient is a 45 y.o. male presenting with cough. The history is provided by the patient.  Cough Cough characteristics:  Non-productive and hacking Severity:  Severe Onset quality:  Gradual Duration:  3 days Timing:  Constant Progression:  Worsening Chronicity:  New Smoker: no   Context: upper respiratory infection   Relieved by:  Nothing Worsened by:  Nothing tried Ineffective treatments:  None tried Associated symptoms: chills, fever, headaches and myalgias   Associated symptoms: no chest pain, no ear pain, no rash, no rhinorrhea, no shortness of breath, no sinus congestion, no sore throat and no wheezing   Associated symptoms comment:  Has diffuse muscle aches but worse in the lower back and occasionally will move to the stomach.  Occasionally will have some discomfort with urination.  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 GERD (gastroesophageal reflux disease)    History reviewed. No pertinent past surgical history. Family History  Problem Relation Age of Onset  . Hypertension Mother   . Diabetes type II Mother   . Diabetes type II Father    Social History  Substance Use Topics  . Smoking status: Never Smoker   . Smokeless tobacco: Never Used  . Alcohol Use: No    Review of Systems  Constitutional: Positive for fever and chills.  HENT: Negative for ear pain, rhinorrhea and sore throat.   Respiratory: Positive for cough. Negative for shortness of breath and wheezing.   Cardiovascular: Negative for chest pain.  Musculoskeletal: Positive  for myalgias. Negative for joint swelling, neck pain and neck stiffness.  Skin: Negative for rash.  Neurological: Positive for headaches. Negative for dizziness.      Allergies  Review of patient's allergies indicates no known allergies.  Home Medications   Prior to Admission medications   Medication Sig Start Date End Date Taking? Authorizing Provider  amitriptyline (ELAVIL) 50 MG tablet Take 50 mg by mouth at bedtime.   Yes Historical Provider, MD  DM-Doxylamine-Acetaminophen (VICKS NYQUIL COLD & FLU PO) Take by mouth.   Yes Historical Provider, MD  hydrOXYzine (ATARAX/VISTARIL) 10 MG tablet Take 10 mg by mouth 3 (three) times daily as needed for itching.   Yes Historical Provider, MD  lisinopril (PRINIVIL,ZESTRIL) 5 MG tablet TAKE ONE TABLET BY MOUTH ONCE DAILY 11/28/15  Yes Narda Bonds, MD  benzonatate (TESSALON) 100 MG capsule Take 1 capsule (100 mg total) by mouth every 8 (eight) hours. 12/25/15   Tatyana Kirichenko, PA-C  cetirizine (ZYRTEC) 10 MG tablet Take 1 tablet (10 mg total) by mouth daily. 05/07/15   Hope Orlene Och, NP  chlorpheniramine-HYDROcodone (TUSSIONEX PENNKINETIC ER) 10-8 MG/5ML LQCR Take 5 mLs by mouth every 12 (twelve) hours as needed for cough (and pain). 01/03/15   Trixie Dredge, PA-C  famotidine (PEPCID) 20 MG tablet Take 1 tablet (20 mg total) by mouth 2 (two) times daily. 05/07/15   Hope Orlene Och, NP  hydrOXYzine (ATARAX/VISTARIL) 25 MG tablet Take 1 tablet (25 mg total) by mouth every 8 (eight) hours as needed for itching. Patient not taking: Reported on  01/27/2016 05/07/15   Hope Orlene OchM Neese, NP  ibuprofen (ADVIL,MOTRIN) 600 MG tablet Take 1 tablet (600 mg total) by mouth every 6 (six) hours as needed. 12/25/15   Tatyana Kirichenko, PA-C  oxymetazoline (AFRIN NASAL SPRAY) 0.05 % nasal spray Place 1 spray into both nostrils 2 (two) times daily. X 3 days 01/03/15   Trixie DredgeEmily West, PA-C  predniSONE (DELTASONE) 20 MG tablet Take 2 tablets (40 mg total) by mouth daily. 12/25/15    Tatyana Kirichenko, PA-C   BP 146/98 mmHg  Pulse 130  Temp(Src) 101.3 F (38.5 C) (Oral)  Resp 20  SpO2 97% Physical Exam  Constitutional: He is oriented to person, place, and time. He appears well-developed and well-nourished. No distress.  HENT:  Head: Normocephalic and atraumatic.  Right Ear: Tympanic membrane and ear canal normal.  Left Ear: Tympanic membrane and ear canal normal.  Mouth/Throat: Oropharynx is clear and moist.  Eyes: Conjunctivae and EOM are normal. Pupils are equal, round, and reactive to light.  Neck: Normal range of motion. Neck supple.  Cardiovascular: Regular rhythm and intact distal pulses.  Tachycardia present.   No murmur heard. Pulmonary/Chest: Effort normal and breath sounds normal. No respiratory distress. He has no wheezes. He has no rales. He exhibits no tenderness.  Abdominal: Soft. He exhibits no distension. There is no tenderness. There is no rebound and no guarding.  Musculoskeletal: Normal range of motion. He exhibits no edema or tenderness.  Lymphadenopathy:    He has no cervical adenopathy.  Neurological: He is alert and oriented to person, place, and time.  Skin: Skin is warm and dry. No rash noted. No erythema.  Psychiatric: He has a normal mood and affect. His behavior is normal.  Nursing note and vitals reviewed.   ED Course  Procedures (including critical care time) Labs Review Labs Reviewed  URINALYSIS, ROUTINE W REFLEX MICROSCOPIC (NOT AT [facility])    Imaging Review Dg Chest 2 View  01/27/2016  CLINICAL DATA:  Cough and fever for few days EXAM: CHEST  2 VIEW COMPARISON:  12/25/2015 FINDINGS: Cardiomediastinal silhouette is stable. No acute infiltrate or pleural effusion. No pulmonary edema. Bony thorax is unremarkable. IMPRESSION: No active cardiopulmonary disease. Electronically Signed   By: Natasha MeadLiviu  Pop M.D.   On: 01/27/2016 09:17   I have personally reviewed and evaluated these images and lab results as part of my medical  decision-making.   EKG Interpretation None      MDM   Final diagnoses:  Flu    Pt with symptoms consistent with influenza.  Normal exam here but is febrile.  No signs of breathing difficulty  No signs of strep pharyngitis, otitis or abnormal abdominal findings.  Pt does say that occasionally he has some discomfort from urinating but blames it on his prostate issues however due to having back pain which most likely is from myalgias from flu or other viral illness will check UA. CXR wnl and UA pending.   Will continue antipyretica and rest and fluids and return for any further problems.  UA wnl.  Repeat VS HR 95.   Gwyneth SproutWhitney Prajna Vanderpool, MD 01/27/16 1151

## 2016-01-31 ENCOUNTER — Encounter (HOSPITAL_COMMUNITY): Payer: Self-pay | Admitting: *Deleted

## 2016-01-31 ENCOUNTER — Emergency Department (HOSPITAL_COMMUNITY)
Admission: EM | Admit: 2016-01-31 | Discharge: 2016-01-31 | Disposition: A | Discharge status: Home / Self Care | Payer: Medicaid Other | Attending: Emergency Medicine | Admitting: Emergency Medicine

## 2016-01-31 DIAGNOSIS — Z8719 Personal history of other diseases of the digestive system: Secondary | ICD-10-CM | POA: Insufficient documentation

## 2016-01-31 DIAGNOSIS — R509 Fever, unspecified: Secondary | ICD-10-CM | POA: Diagnosis present

## 2016-01-31 DIAGNOSIS — Z79899 Other long term (current) drug therapy: Secondary | ICD-10-CM | POA: Diagnosis not present

## 2016-01-31 DIAGNOSIS — R3911 Hesitancy of micturition: Secondary | ICD-10-CM | POA: Diagnosis not present

## 2016-01-31 DIAGNOSIS — R6889 Other general symptoms and signs: Secondary | ICD-10-CM

## 2016-01-31 DIAGNOSIS — R05 Cough: Secondary | ICD-10-CM | POA: Insufficient documentation

## 2016-01-31 DIAGNOSIS — M791 Myalgia: Secondary | ICD-10-CM | POA: Diagnosis not present

## 2016-01-31 LAB — URINALYSIS, ROUTINE W REFLEX MICROSCOPIC
Bilirubin Urine: NEGATIVE
Glucose, UA: NEGATIVE mg/dL
HGB URINE DIPSTICK: NEGATIVE
KETONES UR: NEGATIVE mg/dL
Leukocytes, UA: NEGATIVE
Nitrite: NEGATIVE
PROTEIN: NEGATIVE mg/dL
Specific Gravity, Urine: 1.02 (ref 1.005–1.030)
pH: 6.5 (ref 5.0–8.0)

## 2016-01-31 MED ORDER — IBUPROFEN 800 MG PO TABS: 800.0000 mg | ORAL_TABLET | Freq: Three times a day (TID) | ORAL | Status: DC | PRN

## 2016-01-31 MED ORDER — KETOROLAC TROMETHAMINE 30 MG/ML IJ SOLN
30.0000 mg | Freq: Once | INTRAMUSCULAR | Status: AC
  Administered 2016-01-31: 30 mg via INTRAMUSCULAR
  Filled 2016-01-31: qty 1

## 2016-01-31 MED ORDER — ALBUTEROL SULFATE HFA 108 (90 BASE) MCG/ACT IN AERS: 1.0000 | INHALATION_SPRAY | Freq: Four times a day (QID) | RESPIRATORY_TRACT | Status: DC | PRN

## 2016-01-31 MED ORDER — DM-GUAIFENESIN ER 30-600 MG PO TB12: 1.0000 | ORAL_TABLET | Freq: Two times a day (BID) | ORAL | Status: DC | PRN

## 2016-01-31 NOTE — ED Notes (Signed)
Pt reports since 01-28-16 at last ED visit he now has a productive cough. Pt also reports fever and wheezing. PT speaking i n full sentences with no acute distress.Pt does not have fever at this time.

## 2016-01-31 NOTE — ED Notes (Signed)
Declined W/C at D/C and was escorted to lobby by RN. 

## 2016-01-31 NOTE — Discharge Instructions (Signed)
Please call [facility] to schedule a follow up for your urinary symptoms. Your urine test today was normal.  You likely have a virus, possibly the flu. Take the medications as prescribed as needed for your symptoms. Please call your primary care provider to schedule a check up appointment. Return to the ER for new or worsening symptoms.

## 2016-01-31 NOTE — ED Provider Notes (Signed)
CSN: [id number]     Arrival date & time 01/31/16  0744 History   First MD Initiated Contact with Patient 01/31/16 0747     Chief Complaint  Patient presents with  . Fever  . Cough    HPI   Gerald Parker is an 45 y.o. male who presents to the ED for evaluation of cough and fever. He states he was seen in the ED on 4/13 and diagnosed with the flu. He states he finished the tussionex as prescribed but since that visit he continues to have severe hacking cough productive of clear sputum. States at night he feels like he is wheezing with the cough. He states he continues to have a fever of ~100.42F every day that will improve with tylenol or ibuprofen. Endorses diffuse body aches particularly in his back. Denies abdominal pain, n/v/d. He does states that he has a h/o BPH and has had intermittent increased urinary hesitancy recently, now worse in particular over the past few days. Denies dysuria, hematuria. He has not seen a urologist recently.   Past Medical History  Diagnosis Date  . GERD (gastroesophageal reflux disease)    History reviewed. No pertinent past surgical history. Family History  Problem Relation Age of Onset  . Hypertension Mother   . Diabetes type II Mother   . Diabetes type II Father    Social History  Substance Use Topics  . Smoking status: Never Smoker   . Smokeless tobacco: Never Used  . Alcohol Use: No    Review of Systems  All other systems reviewed and are negative.     Allergies  Review of patient's allergies indicates no known allergies.  Home Medications   Prior to Admission medications   Medication Sig Start Date End Date Taking? Authorizing Provider  amitriptyline (ELAVIL) 50 MG tablet Take 50 mg by mouth at bedtime.    Historical Provider, MD  benzonatate (TESSALON) 100 MG capsule Take 1 capsule (100 mg total) by mouth every 8 (eight) hours. Patient not taking: Reported on 01/27/2016 12/25/15   Tatyana Kirichenko, PA-C  cetirizine (ZYRTEC) 10 MG  tablet Take 1 tablet (10 mg total) by mouth daily. Patient not taking: Reported on 01/27/2016 05/07/15   Janne Napoleon, NP  chlorpheniramine-HYDROcodone (TUSSIONEX) 10-8 MG/5ML LQCR Take 5 mLs by mouth every 12 (twelve) hours as needed for cough (and pain). 01/27/16   Gwyneth Sprout, MD  DM-Doxylamine-Acetaminophen (VICKS NYQUIL COLD & FLU PO) Take 15 mLs by mouth 2 (two) times daily as needed. For cold/flu symptoms    Historical Provider, MD  famotidine (PEPCID) 20 MG tablet Take 1 tablet (20 mg total) by mouth 2 (two) times daily. Patient not taking: Reported on 01/27/2016 05/07/15   Janne Napoleon, NP  hydrOXYzine (ATARAX/VISTARIL) 10 MG tablet Take 10 mg by mouth 3 (three) times daily as needed for itching.    Historical Provider, MD  hydrOXYzine (ATARAX/VISTARIL) 25 MG tablet Take 1 tablet (25 mg total) by mouth every 8 (eight) hours as needed for itching. Patient not taking: Reported on 01/27/2016 05/07/15   Janne Napoleon, NP  ibuprofen (ADVIL,MOTRIN) 600 MG tablet Take 1 tablet (600 mg total) by mouth every 6 (six) hours as needed. Patient not taking: Reported on 01/27/2016 12/25/15   Tatyana Kirichenko, PA-C  lisinopril (PRINIVIL,ZESTRIL) 5 MG tablet TAKE ONE TABLET BY MOUTH ONCE DAILY 11/28/15   Narda Bonds, MD  oxymetazoline (AFRIN NASAL SPRAY) 0.05 % nasal spray Place 1 spray into both nostrils 2 (two)  times daily. X 3 days Patient not taking: Reported on 01/27/2016 01/03/15   Trixie DredgeEmily West, PA-C  predniSONE (DELTASONE) 20 MG tablet Take 2 tablets (40 mg total) by mouth daily. Patient not taking: Reported on 01/27/2016 12/25/15   Tatyana Kirichenko, PA-C   BP 136/89 mmHg  Pulse 103  Temp(Src) 98.8 F (37.1 C) (Oral)  Resp 18  Ht 5\' 8"  (1.727 m)  Wt 81.647 kg  BMI 27.38 kg/m2  SpO2 100% Physical Exam  Constitutional: He is oriented to person, place, and time.  HENT:  Right Ear: External ear normal.  Left Ear: External ear normal.  Nose: Nose normal.  Mouth/Throat: Oropharynx is clear and  moist. No oropharyngeal exudate.  Eyes: Conjunctivae and EOM are normal. Pupils are equal, round, and reactive to light.  Neck: Normal range of motion. Neck supple.  Cardiovascular: Normal rate, regular rhythm, normal heart sounds and intact distal pulses.   Pulmonary/Chest: Effort normal and breath sounds normal. No respiratory distress. He has no wheezes. He exhibits no tenderness.  Abdominal: Soft. Bowel sounds are normal. He exhibits no distension. There is no tenderness. There is no rebound and no guarding.  Genitourinary:  declined  Musculoskeletal: He exhibits no edema.  Neurological: He is alert and oriented to person, place, and time. No cranial nerve deficit.  Skin: Skin is warm and dry.  Psychiatric: He has a normal mood and affect.  Nursing note and vitals reviewed.  Filed Vitals:   01/31/16 0756 01/31/16 0920  BP: 136/89 138/92  Pulse: 103 90  Temp: 98.8 F (37.1 C)   TempSrc: Oral   Resp: 18 18  Height: 5\' 8"  (1.727 m)   Weight: 81.647 kg   SpO2: 100% 99%     ED Course  Procedures (including critical care time) Labs Review Labs Reviewed  URINALYSIS, ROUTINE W REFLEX MICROSCOPIC (NOT AT [facility])    Imaging Review No results found. I have personally reviewed and evaluated these images and lab results as part of my medical decision-making.   EKG Interpretation None      MDM   Final diagnoses:  Flu-like symptoms  Urinary hesitancy    Suspect continued symptoms from viral illness, likely the flu. Nonfocal exam. HR improved. Pt afebrile. Discussed with pt that we will hold off on CXR as he just had one a few days ago. Rx given for supportive meds. UA today negative. Referral to urology given. Suspect urinary hesitancy due to chronic BPH. ER return precautions given.    Carlene CoriaSerena Y Nysir Fergusson, PA-C 01/31/16 1149  Rolland PorterMark James, MD 02/03/16 [phone]

## 2016-02-26 ENCOUNTER — Ambulatory Visit (HOSPITAL_COMMUNITY)
Admission: EM | Admit: 2016-02-26 | Discharge: 2016-02-26 | Disposition: A | Discharge status: Home / Self Care | Payer: Medicaid Other | Attending: Emergency Medicine | Admitting: Emergency Medicine

## 2016-02-26 ENCOUNTER — Encounter (HOSPITAL_COMMUNITY): Payer: Self-pay | Admitting: Emergency Medicine

## 2016-02-26 ENCOUNTER — Ambulatory Visit (INDEPENDENT_AMBULATORY_CARE_PROVIDER_SITE_OTHER): Payer: Medicaid Other

## 2016-02-26 DIAGNOSIS — R0602 Shortness of breath: Secondary | ICD-10-CM

## 2016-02-26 DIAGNOSIS — Z833 Family history of diabetes mellitus: Secondary | ICD-10-CM | POA: Insufficient documentation

## 2016-02-26 DIAGNOSIS — Z79899 Other long term (current) drug therapy: Secondary | ICD-10-CM | POA: Insufficient documentation

## 2016-02-26 DIAGNOSIS — Z8249 Family history of ischemic heart disease and other diseases of the circulatory system: Secondary | ICD-10-CM | POA: Diagnosis not present

## 2016-02-26 DIAGNOSIS — R05 Cough: Secondary | ICD-10-CM | POA: Insufficient documentation

## 2016-02-26 DIAGNOSIS — K219 Gastro-esophageal reflux disease without esophagitis: Secondary | ICD-10-CM | POA: Insufficient documentation

## 2016-02-26 DIAGNOSIS — R079 Chest pain, unspecified: Secondary | ICD-10-CM | POA: Insufficient documentation

## 2016-02-26 DIAGNOSIS — R059 Cough, unspecified: Secondary | ICD-10-CM

## 2016-02-26 DIAGNOSIS — R509 Fever, unspecified: Secondary | ICD-10-CM | POA: Diagnosis not present

## 2016-02-26 LAB — POCT I-STAT, CHEM 8
BUN: 9 mg/dL (ref 6–20)
CHLORIDE: 102 mmol/L (ref 101–111)
CREATININE: 0.9 mg/dL (ref 0.61–1.24)
Calcium, Ion: 1.06 mmol/L — ABNORMAL LOW (ref 1.12–1.23)
Glucose, Bld: 97 mg/dL (ref 65–99)
HCT: 47 % (ref 39.0–52.0)
Hemoglobin: 16 g/dL (ref 13.0–17.0)
Potassium: 4.3 mmol/L (ref 3.5–5.1)
SODIUM: 137 mmol/L (ref 135–145)
TCO2: 26 mmol/L (ref 0–100)

## 2016-02-26 MED ORDER — ALBUTEROL SULFATE HFA 108 (90 BASE) MCG/ACT IN AERS: 1.0000 | INHALATION_SPRAY | Freq: Four times a day (QID) | RESPIRATORY_TRACT | Status: DC | PRN

## 2016-02-26 MED ORDER — IPRATROPIUM-ALBUTEROL 0.5-2.5 (3) MG/3ML IN SOLN
3.0000 mL | Freq: Once | RESPIRATORY_TRACT | Status: AC
  Administered 2016-02-26: 3 mL via RESPIRATORY_TRACT

## 2016-02-26 MED ORDER — PREDNISONE 20 MG PO TABS: 60.0000 mg | ORAL_TABLET | Freq: Every day | ORAL | Status: DC

## 2016-02-26 MED ORDER — IPRATROPIUM-ALBUTEROL 0.5-2.5 (3) MG/3ML IN SOLN
RESPIRATORY_TRACT | Status: AC
  Filled 2016-02-26: qty 3

## 2016-02-26 NOTE — ED Notes (Signed)
Pt. Stated, I've been sick for 3 weeks with pain all over, taken Tylenol, Ibuprofen.

## 2016-02-26 NOTE — ED Provider Notes (Signed)
CSN: [id number]     Arrival date & time 02/26/16  1255 History   None   PMI   Patient is a 45 year old male presenting today with complaints of productive green cough with intermittent fever and chills for approximately 3 weeks with no improvement. Patient denies sore throat, nausea, or vomiting. States positive shortness of breath with significant sweats and chills. Patient states he is having night sweats daily that require him to change his bed clothes and pajamas. Denies history of seasonal or environmental allergies or recent head cold or sinus infection. Patient states history of bronchitis last year medical history significant for high blood pressure for which she is currently taking his medication. Patient states he has been taking over-the-counter cough medicine that is not for patients with high blood pressure.   Chief Complaint  Patient presents with  . Chest Pain  . Generalized Body Aches   (Consider location/radiation/quality/duration/timing/severity/associated sxs/prior Treatment) Patient is a 45 y.o. male presenting with chest pain.  Chest Pain Associated symptoms: cough, fatigue, fever and shortness of breath   Associated symptoms: no dysphagia, no nausea, no palpitations and not vomiting     Past Medical History  Diagnosis Date  . GERD (gastroesophageal reflux disease)    History reviewed. No pertinent past surgical history. Family History  Problem Relation Age of Onset  . Hypertension Mother   . Diabetes type II Mother   . Diabetes type II Father    Social History  Substance Use Topics  . Smoking status: Never Smoker   . Smokeless tobacco: Never Used  . Alcohol Use: No    Review of Systems  Constitutional: Positive for fever, chills and fatigue. Negative for unexpected weight change.  HENT: Positive for congestion, ear pain and voice change. Negative for sinus pressure, sneezing and trouble swallowing.   Eyes: Negative.  Negative for itching.  Respiratory:  Positive for cough and shortness of breath. Negative for wheezing.   Cardiovascular: Positive for chest pain. Negative for palpitations and leg swelling.  Gastrointestinal: Negative.  Negative for nausea and vomiting.  Endocrine: Negative.   Genitourinary: Negative.   Musculoskeletal: Positive for myalgias. Negative for neck pain and neck stiffness.  Skin: Negative.  Negative for pallor and rash.  Allergic/Immunologic: Negative.  Negative for environmental allergies, food allergies and immunocompromised state.  Neurological: Negative.   Hematological: Negative.   Psychiatric/Behavioral: Negative.     Allergies  Review of patient's allergies indicates no known allergies.  Home Medications   Prior to Admission medications   Medication Sig Start Date End Date Taking? Authorizing Provider  albuterol (PROVENTIL HFA;VENTOLIN HFA) 108 (90 Base) MCG/ACT inhaler Inhale 1-2 puffs into the lungs every 6 (six) hours as needed for wheezing or shortness of breath. 02/26/16   Servando Salinaatherine H Coreena Rubalcava, NP  amitriptyline (ELAVIL) 50 MG tablet Take 50 mg by mouth at bedtime.    Historical Provider, MD  benzonatate (TESSALON) 100 MG capsule Take 1 capsule (100 mg total) by mouth every 8 (eight) hours. Patient not taking: Reported on 01/27/2016 12/25/15   Tatyana Kirichenko, PA-C  cetirizine (ZYRTEC) 10 MG tablet Take 1 tablet (10 mg total) by mouth daily. Patient not taking: Reported on 01/27/2016 05/07/15   [name], NP  chlorpheniramine-HYDROcodone (TUSSIONEX) 10-8 MG/5ML LQCR Take 5 mLs by mouth every 12 (twelve) hours as needed for cough (and pain). 01/27/16   [name], MD  dextromethorphan-guaiFENesin (MUCINEX DM) 30-600 MG 12hr tablet Take 1 tablet by mouth 2 (two) times daily as needed for cough.  01/31/16   Carlene Coria, PA-C  DM-Doxylamine-Acetaminophen (VICKS NYQUIL COLD & FLU PO) Take 15 mLs by mouth 2 (two) times daily as needed. For cold/flu symptoms    Historical Provider, MD  famotidine  (PEPCID) 20 MG tablet Take 1 tablet (20 mg total) by mouth 2 (two) times daily. Patient not taking: Reported on 01/27/2016 05/07/15   Janne Napoleon, NP  hydrOXYzine (ATARAX/VISTARIL) 10 MG tablet Take 10 mg by mouth 3 (three) times daily as needed for itching.    Historical Provider, MD  hydrOXYzine (ATARAX/VISTARIL) 25 MG tablet Take 1 tablet (25 mg total) by mouth every 8 (eight) hours as needed for itching. Patient not taking: Reported on 01/27/2016 05/07/15   Janne Napoleon, NP  ibuprofen (ADVIL,MOTRIN) 600 MG tablet Take 1 tablet (600 mg total) by mouth every 6 (six) hours as needed. Patient not taking: Reported on 01/27/2016 12/25/15   Tatyana Kirichenko, PA-C  ibuprofen (ADVIL,MOTRIN) 800 MG tablet Take 1 tablet (800 mg total) by mouth every 8 (eight) hours as needed for fever or moderate pain. 01/31/16   [name] Sam, PA-C  lisinopril (PRINIVIL,ZESTRIL) 5 MG tablet TAKE ONE TABLET BY MOUTH ONCE DAILY 11/28/15   Narda Bonds, MD  oxymetazoline (AFRIN NASAL SPRAY) 0.05 % nasal spray Place 1 spray into both nostrils 2 (two) times daily. X 3 days Patient not taking: Reported on 01/27/2016 01/03/15   Trixie Dredge, PA-C  predniSONE (DELTASONE) 20 MG tablet Take 3 tablets (60 mg total) by mouth daily. 02/26/16   Servando Salina, NP   Meds Ordered and Administered this Visit   Medications  ipratropium-albuterol (DUONEB) 0.5-2.5 (3) MG/3ML nebulizer solution 3 mL (3 mLs Nebulization Given 02/26/16 1354)    BP 147/93 mmHg  Pulse 92  Temp(Src) 98.6 F (37 C) (Oral)  Resp 16  SpO2 98% No data found.   Physical Exam  Constitutional: He is oriented to person, place, and time. He appears well-developed and well-nourished. No distress.  Neck: Normal range of motion. Neck supple.  Negative for nuchal rigidity.  Cardiovascular: Normal rate, regular rhythm, normal heart sounds and intact distal pulses.  Exam reveals no gallop and no friction rub.   No murmur heard. Pulmonary/Chest: Effort normal and breath  sounds normal. No respiratory distress. He has no wheezes. He has no rales. He exhibits no tenderness.  Diminished air exchange heard throughout; patient coughs with deep breathing. No adventitious breath sounds noted.  Musculoskeletal: Normal range of motion. He exhibits no edema or tenderness.  Lymphadenopathy:    He has no cervical adenopathy.  Neurological: He is alert and oriented to person, place, and time.  Skin: Skin is warm and dry. No rash noted. He is not diaphoretic.  Nursing note and vitals reviewed.  The patient was given a duoneb.  States he felt "much better".  Increased air exchange heard throughout following HHN.   ED Course  Procedures (including critical care time)  Labs Review Labs Reviewed  POCT I-STAT, CHEM 8 - Abnormal; Notable for the following:    Calcium, Ion 1.06 (*)    All other components within normal limits  HIV ANTIBODY (ROUTINE TESTING)   Results for orders placed or performed during the hospital encounter of 02/26/16  I-STAT, chem 8  Result Value Ref Range   Sodium 137 135 - 145 mmol/L   Potassium 4.3 3.5 - 5.1 mmol/L   Chloride 102 101 - 111 mmol/L   BUN 9 6 - 20 mg/dL   Creatinine, Ser  0.90 0.61 - 1.24 mg/dL   Glucose, Bld 97 65 - 99 mg/dL   Calcium, Ion 1.19 (L) 1.12 - 1.23 mmol/L   TCO2 26 0 - 100 mmol/L   Hemoglobin 16.0 13.0 - 17.0 g/dL   HCT 14.7 82.9 - 56.2 %   HIV Testing Pending   Imaging Review Dg Chest 2 View  02/26/2016  CLINICAL DATA:  Chest pain, cough and fever four days. EXAM: CHEST  2 VIEW COMPARISON:  01/27/2016 and 12/25/2015 FINDINGS: Lungs are adequately inflated and otherwise clear. Cardiomediastinal silhouette, bones and soft tissues are within normal. IMPRESSION: No active cardiopulmonary disease. Electronically Signed   By: Elberta Fortis M.D.   On: 02/26/2016 13:47   Differentials included TB and new onset HIV infection.  EKG   Normal sinus rhythm with a rightward axis and nonspecific T wave abnormality   ventricular rate at 88 bpm  PR interval 176 ms, QRS duration 96 ms, QT/QTc 356/426 ms P-R-T axes 61 90 13. T wave is present but appears depressed in V5 and 6.  EKG reviewed with Dr. Randal Buba.    MDM   1. Cough   2. Shortness of breath    Meds ordered this encounter  Medications  . ipratropium-albuterol (DUONEB) 0.5-2.5 (3) MG/3ML nebulizer solution 3 mL    Sig:   . DISCONTD: predniSONE (DELTASONE) 20 MG tablet    Sig: Take 3 tablets (60 mg total) by mouth daily.    Dispense:  15 tablet    Refill:  0  . DISCONTD: albuterol (PROVENTIL HFA;VENTOLIN HFA) 108 (90 Base) MCG/ACT inhaler    Sig: Inhale 1-2 puffs into the lungs every 6 (six) hours as needed for wheezing or shortness of breath.    Dispense:  1 Inhaler    Refill:  3  . predniSONE (DELTASONE) 20 MG tablet    Sig: Take 3 tablets (60 mg total) by mouth daily.    Dispense:  15 tablet    Refill:  0  . albuterol (PROVENTIL HFA;VENTOLIN HFA) 108 (90 Base) MCG/ACT inhaler    Sig: Inhale 1-2 puffs into the lungs every 6 (six) hours as needed for wheezing or shortness of breath.    Dispense:  1 Inhaler    Refill:  3    The patient was advised to follow up with PCP for follow up and routine care of cough which I suspect is bronchospasm and unrelated to new illness.  The patient verbalizes understanding and agrees to plan of care.       Servando Salina, NP 02/26/16 1447

## 2016-02-26 NOTE — Discharge Instructions (Signed)
You may only use Coricidin HBP for cough  Bronchospasm, Adult A bronchospasm is when the tubes that carry air in and out of your lungs (airways) spasm or tighten. During a bronchospasm it is hard to breathe. This is because the airways get smaller. A bronchospasm can be triggered by:  Allergies. These may be to animals, pollen, food, or mold.  Infection. This is a common cause of bronchospasm.  Exercise.  Irritants. These include pollution, cigarette smoke, strong odors, aerosol sprays, and paint fumes.  Weather changes.  Stress.  Being emotional. HOME CARE   Always have a plan for getting help. Know when to call your doctor and local emergency services (911 in the [place] Know where you can get emergency care.  Only take medicines as told by your doctor.  If you were prescribed an inhaler or nebulizer machine, ask your doctor how to use it correctly. Always use a spacer with your inhaler if you were given one.  Stay calm during an attack. Try to relax and breathe more slowly.  Control your home environment:  Change your heating and air conditioning filter at least once a month.  Limit your use of fireplaces and wood stoves.  Do not  smoke. Do not  allow smoking in your home.  Avoid perfumes and fragrances.  Get rid of pests (such as roaches and mice) and their droppings.  Throw away plants if you see mold on them.  Keep your house clean and dust free.  Replace carpet with wood, tile, or vinyl flooring. Carpet can trap dander and dust.  Use allergy-proof pillows, mattress covers, and box spring covers.  Wash bed sheets and blankets every week in hot water. Dry them in a dryer.  Use blankets that are made of polyester or cotton.  Wash hands frequently. GET HELP IF:  You have muscle aches.  You have chest pain.  The thick spit you spit or cough up (sputum) changes from clear or white to yellow, green, gray, or bloody.  The thick spit you spit or cough up gets  thicker.  There are problems that may be related to the medicine you are given such as:  A rash.  Itching.  Swelling.  Trouble breathing. GET HELP RIGHT AWAY IF:  You feel you cannot breathe or catch your breath.  You cannot stop coughing.  Your treatment is not helping you breathe better.  You have very bad chest pain. MAKE SURE YOU:   Understand these instructions.  Will watch your condition.  Will get help right away if you are not doing well or get worse.   This information is not intended to replace advice given to you by your health care provider. Make sure you discuss any questions you have with your health care provider.   Document Released: 07/30/2009 Document Revised: 10/23/2014 Document Reviewed: 03/25/2013 Elsevier Interactive Patient Education 2016 [employer].  How to Use an Inhaler Proper inhaler technique is very important. Good technique ensures that the medicine reaches the lungs. Poor technique results in depositing the medicine on the tongue and back of the throat rather than in the airways. If you do not use the inhaler with good technique, the medicine will not help you. STEPS TO FOLLOW IF USING AN INHALER WITHOUT AN EXTENSION TUBE  Remove the cap from the inhaler.  If you are using the inhaler for the first time, you will need to prime it. Shake the inhaler for 5 seconds and release four puffs into the air,  away from your face. Ask your health care provider or pharmacist if you have questions about priming your inhaler.  Shake the inhaler for 5 seconds before each breath in (inhalation).  Position the inhaler so that the top of the canister faces up.  Put your index finger on the top of the medicine canister. Your thumb supports the bottom of the inhaler.  Open your mouth.  Either place the inhaler between your teeth and place your lips tightly around the mouthpiece, or hold the inhaler 1-2 inches away from your open mouth. If you are unsure  of which technique to use, ask your health care provider.  Breathe out (exhale) normally and as completely as possible.  Press the canister down with your index finger to release the medicine.  At the same time as the canister is pressed, inhale deeply and slowly until your lungs are completely filled. This should take 4-6 seconds. Keep your tongue down.  Hold the medicine in your lungs for 5-10 seconds (10 seconds is best). This helps the medicine get into the small airways of your lungs.  Breathe out slowly, through pursed lips. Whistling is an example of pursed lips.  Wait at least 15-30 seconds between puffs. Continue with the above steps until you have taken the number of puffs your health care provider has ordered. Do not use the inhaler more than your health care provider tells you.  Replace the cap on the inhaler.  Follow the directions from your health care provider or the inhaler insert for cleaning the inhaler. STEPS TO FOLLOW IF USING AN INHALER WITH AN EXTENSION (SPACER)  Remove the cap from the inhaler.  If you are using the inhaler for the first time, you will need to prime it. Shake the inhaler for 5 seconds and release four puffs into the air, away from your face. Ask your health care provider or pharmacist if you have questions about priming your inhaler.  Shake the inhaler for 5 seconds before each breath in (inhalation).  Place the open end of the spacer onto the mouthpiece of the inhaler.  Position the inhaler so that the top of the canister faces up and the spacer mouthpiece faces you.  Put your index finger on the top of the medicine canister. Your thumb supports the bottom of the inhaler and the spacer.  Breathe out (exhale) normally and as completely as possible.  Immediately after exhaling, place the spacer between your teeth and into your mouth. Close your lips tightly around the spacer.  Press the canister down with your index finger to release the  medicine.  At the same time as the canister is pressed, inhale deeply and slowly until your lungs are completely filled. This should take 4-6 seconds. Keep your tongue down and out of the way.  Hold the medicine in your lungs for 5-10 seconds (10 seconds is best). This helps the medicine get into the small airways of your lungs. Exhale.  Repeat inhaling deeply through the spacer mouthpiece. Again hold that breath for up to 10 seconds (10 seconds is best). Exhale slowly. If it is difficult to take this second deep breath through the spacer, breathe normally several times through the spacer. Remove the spacer from your mouth.  Wait at least 15-30 seconds between puffs. Continue with the above steps until you have taken the number of puffs your health care provider has ordered. Do not use the inhaler more than your health care provider tells you.  Remove the spacer  from the inhaler, and place the cap on the inhaler.  Follow the directions from your health care provider or the inhaler insert for cleaning the inhaler and spacer. If you are using different kinds of inhalers, use your quick relief medicine to open the airways 10-15 minutes before using a steroid if instructed to do so by your health care provider. If you are unsure which inhalers to use and the order of using them, ask your health care provider, nurse, or respiratory therapist. If you are using a steroid inhaler, always rinse your mouth with water after your last puff, then gargle and spit out the water. Do not swallow the water. AVOID:  Inhaling before or after starting the spray of medicine. It takes practice to coordinate your breathing with triggering the spray.  Inhaling through the nose (rather than the mouth) when triggering the spray. HOW TO DETERMINE IF YOUR INHALER IS FULL OR NEARLY EMPTY You cannot know when an inhaler is empty by shaking it. A few inhalers are now being made with dose counters. Ask your health care provider  for a prescription that has a dose counter if you feel you need that extra help. If your inhaler does not have a counter, ask your health care provider to help you determine the date you need to refill your inhaler. Write the refill date on a calendar or your inhaler canister. Refill your inhaler 7-10 days before it runs out. Be sure to keep an adequate supply of medicine. This includes making sure it is not expired, and that you have a spare inhaler.  SEEK MEDICAL CARE IF:   Your symptoms are only partially relieved with your inhaler.  You are having trouble using your inhaler.  You have some increase in phlegm. SEEK IMMEDIATE MEDICAL CARE IF:   You feel little or no relief with your inhalers. You are still wheezing and are feeling shortness of breath or tightness in your chest or both.  You have dizziness, headaches, or a fast heart rate.  You have chills, fever, or night sweats.  You have a noticeable increase in phlegm production, or there is blood in the phlegm. MAKE SURE YOU:   Understand these instructions.  Will watch your condition.  Will get help right away if you are not doing well or get worse.   This information is not intended to replace advice given to you by your health care provider. Make sure you discuss any questions you have with your health care provider.   Document Released: 09/29/2000 Document Revised: 07/23/2013 Document Reviewed: 05/01/2013 Elsevier Interactive Patient Education [employer]

## 2016-02-27 LAB — HIV ANTIBODY (ROUTINE TESTING W REFLEX): HIV SCREEN 4TH GENERATION: NONREACTIVE

## 2016-03-04 ENCOUNTER — Telehealth (HOSPITAL_COMMUNITY): Payer: Self-pay | Admitting: Emergency Medicine

## 2016-03-04 NOTE — ED Notes (Signed)
Called pt and notified of recent lab results from visit 5/13 Pt ID'd properly... Reports feeling better and body aches have subsided.   Per Dr. Dayton ScrapeMurray,  Please let patient know that HIV test is negative. Recheck as needed. LM  Adv pt if sx are not getting better to return  Pt verb understanding

## 2016-03-18 ENCOUNTER — Other Ambulatory Visit: Payer: Self-pay | Admitting: Family Medicine

## 2016-03-21 NOTE — Telephone Encounter (Signed)
Will approve one refill. Patient requires a follow-up visit for future refills. 

## 2016-03-21 NOTE — Telephone Encounter (Signed)
appt made for 04-14-16. Gerald Parker,CMA

## 2016-04-14 ENCOUNTER — Ambulatory Visit (INDEPENDENT_AMBULATORY_CARE_PROVIDER_SITE_OTHER): Payer: Medicaid Other | Admitting: Internal Medicine

## 2016-04-14 ENCOUNTER — Encounter: Payer: Self-pay | Admitting: Internal Medicine

## 2016-04-14 VITALS — BP 136/88 | HR 97 | Temp 97.9°F | Ht 68.0 in | Wt 186.4 lb

## 2016-04-14 DIAGNOSIS — I10 Essential (primary) hypertension: Secondary | ICD-10-CM

## 2016-04-14 DIAGNOSIS — R37 Sexual dysfunction, unspecified: Secondary | ICD-10-CM

## 2016-04-14 MED ORDER — LISINOPRIL 5 MG PO TABS: 5.0000 mg | ORAL_TABLET | Freq: Every day | ORAL | Status: DC

## 2016-04-14 NOTE — Patient Instructions (Signed)
Mr. Gerald Parker,  I have sent your blood pressure medication to [facility] on [address]  I have requested the records from your urologist's office. I will call you to review plan of care.  Best,  Dr. Sampson GoonFitzgerald

## 2016-04-14 NOTE — Progress Notes (Signed)
   Subjective:    Patient ID: Gerald Parker, male    DOB: 08/26/1971, 45 y.o.   MRN: [id number]  HPI  Ignacio Lainez is a 45-y/o male with history of HTN, hydrocele, and BPH who presents for HTN follow-up.   HTN: - Has been on lisinopril 5 mg for about 10 years - Was told to make appointment for BP check before refills could be given  - Had BMP 02/2016 with SCr 0.90  Complaint about sexual function: - Expressed concern that he cannot have sex more than one time in a day with his wife - Denies dysuria, scrotal or penile pain - Denies penile discharge - Says he has been followed by urology for BPH and was told this year he does not require any more follow-up  Review of Systems  Constitutional: Negative for fever and unexpected weight change.  Genitourinary: Negative for dysuria, frequency and difficulty urinating.  Musculoskeletal: Negative for back pain.  Skin: Negative for rash.   Social: Never smoker    Objective: Blood pressure 136/88, pulse 97, temperature 97.9 F (36.6 C), temperature source Oral, height 5\' 8"  (1.727 m), weight 186 lb 6.4 oz (84.55 kg).   Physical Exam  Cardiovascular: Normal rate, regular rhythm and normal heart sounds.   No murmur heard. Pulmonary/Chest: Effort normal and breath sounds normal. No respiratory distress. He has no wheezes.   Last PSA performed 12/29/09 was 0.59.     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

## 2016-04-16 DIAGNOSIS — R37 Sexual dysfunction, unspecified: Secondary | ICD-10-CM

## 2016-04-16 HISTORY — DX: Sexual dysfunction, unspecified: R37

## 2016-04-16 NOTE — Assessment & Plan Note (Addendum)
-   Suspect patient has longer refractory period with age - Will request Urology records to review any follow-up recommendations from last appointment

## 2016-04-16 NOTE — Assessment & Plan Note (Signed)
-   BP has been well controlled on low-dose of lisinopril - Provided refill

## 2016-06-30 ENCOUNTER — Other Ambulatory Visit: Payer: Self-pay | Admitting: *Deleted

## 2016-06-30 DIAGNOSIS — I10 Essential (primary) hypertension: Secondary | ICD-10-CM

## 2016-06-30 MED ORDER — AMITRIPTYLINE HCL 50 MG PO TABS: 50.0000 mg | ORAL_TABLET | Freq: Every day | ORAL | 0 refills | Status: DC

## 2016-06-30 MED ORDER — LISINOPRIL 5 MG PO TABS: 5.0000 mg | ORAL_TABLET | Freq: Every day | ORAL | 0 refills | Status: DC

## 2016-06-30 MED ORDER — HYDROXYZINE HCL 10 MG PO TABS: 10.0000 mg | ORAL_TABLET | Freq: Three times a day (TID) | ORAL | 0 refills | Status: DC | PRN

## 2016-08-20 ENCOUNTER — Other Ambulatory Visit: Payer: Self-pay | Admitting: Family Medicine

## 2016-09-19 ENCOUNTER — Other Ambulatory Visit: Payer: Self-pay | Admitting: Family Medicine

## 2016-09-19 DIAGNOSIS — I10 Essential (primary) hypertension: Secondary | ICD-10-CM

## 2016-10-16 ENCOUNTER — Other Ambulatory Visit: Payer: Self-pay | Admitting: Family Medicine

## 2016-10-16 DIAGNOSIS — I10 Essential (primary) hypertension: Secondary | ICD-10-CM

## 2016-11-06 ENCOUNTER — Emergency Department (HOSPITAL_COMMUNITY)
Admission: EM | Admit: 2016-11-06 | Discharge: 2016-11-07 | Discharge status: Against Medical Advice | Payer: Medicaid Other

## 2016-11-06 NOTE — ED Notes (Signed)
Patient called in main ED waiting room area with no response 

## 2016-11-06 NOTE — ED Triage Notes (Signed)
Pt did not answer.

## 2016-11-06 NOTE — ED Triage Notes (Signed)
Called patient X2. No response.  

## 2016-11-07 ENCOUNTER — Emergency Department (HOSPITAL_COMMUNITY)
Admission: EM | Admit: 2016-11-07 | Discharge: 2016-11-07 | Disposition: A | Discharge status: Home / Self Care | Payer: Medicaid Other | Attending: Emergency Medicine | Admitting: Emergency Medicine

## 2016-11-07 ENCOUNTER — Encounter (HOSPITAL_COMMUNITY): Payer: Self-pay | Admitting: Emergency Medicine

## 2016-11-07 DIAGNOSIS — M65342 Trigger finger, left ring finger: Secondary | ICD-10-CM | POA: Diagnosis not present

## 2016-11-07 DIAGNOSIS — I10 Essential (primary) hypertension: Secondary | ICD-10-CM | POA: Insufficient documentation

## 2016-11-07 DIAGNOSIS — X509XXA Other and unspecified overexertion or strenuous movements or postures, initial encounter: Secondary | ICD-10-CM | POA: Insufficient documentation

## 2016-11-07 DIAGNOSIS — Y990 Civilian activity done for income or pay: Secondary | ICD-10-CM | POA: Insufficient documentation

## 2016-11-07 DIAGNOSIS — Y929 Unspecified place or not applicable: Secondary | ICD-10-CM | POA: Diagnosis not present

## 2016-11-07 DIAGNOSIS — Z79899 Other long term (current) drug therapy: Secondary | ICD-10-CM | POA: Diagnosis not present

## 2016-11-07 DIAGNOSIS — Y9389 Activity, other specified: Secondary | ICD-10-CM | POA: Insufficient documentation

## 2016-11-07 DIAGNOSIS — M79645 Pain in left finger(s): Secondary | ICD-10-CM | POA: Diagnosis present

## 2016-11-07 HISTORY — DX: Essential (primary) hypertension: I10

## 2016-11-07 MED ORDER — NAPROXEN 500 MG PO TABS: 500.0000 mg | ORAL_TABLET | Freq: Two times a day (BID) | ORAL | 0 refills | Status: DC

## 2016-11-07 NOTE — ED Notes (Signed)
Pt states he understands in structions. Home stable with steady gait after all wuestions answered.

## 2016-11-07 NOTE — ED Notes (Signed)
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

## 2016-11-07 NOTE — Discharge Instructions (Signed)
Please read and follow all provided instructions.  Your diagnoses today include:  1. Trigger ring finger of left hand    Tests performed today include:  Vital signs. See below for your results today.   Medications prescribed:   Naproxen - anti-inflammatory pain medication  Do not exceed 500mg  naproxen every 12 hours, take with food  You have been prescribed an anti-inflammatory medication or NSAID. Take with food. Take smallest effective dose for the shortest duration needed for your pain. Stop taking if you experience stomach pain or vomiting.   Take any prescribed medications only as directed.  Home care instructions:   Follow any educational materials contained in this packet  Wear splint for 4 weeks.   Follow R.I.C.E. Protocol:  R - rest your injury   I  - use ice on injury without applying directly to skin  C - compress injury with bandage or splint  E - elevate the injury as much as possible  Follow-up instructions: Please follow-up with the provided orthopedic physician (bone specialist).  Return instructions:   Please return if your fingers are numb or tingling, appear gray or blue, or you have severe pain (also elevate the arm and loosen splint or wrap if you were given one)  Please return to the Emergency Department if you experience worsening symptoms.   Please return if you have any other emergent concerns.  Additional Information:  Your vital signs today were: BP 138/99 (BP Location: Right Arm)    Pulse 106    Temp 98 F (36.7 C) (Oral)    Resp 16    Ht 5\' 7"  (1.702 m)    Wt 79.4 kg    SpO2 100%    BMI 27.41 kg/m  If your blood pressure (BP) was elevated above 135/85 this visit, please have this repeated by your doctor within one month. --------------

## 2016-11-07 NOTE — ED Provider Notes (Signed)
[facility]-EMERGENCY DEPT Provider Note   CSN: [id number] Arrival date & time: 11/07/16  [id number]   By signing my name below, I, Gerald Parker, attest that this documentation has been prepared under the direction and in the presence of  RaytheonJosh Vessie Olmsted PA-C. Electronically Signed: Clovis PuAvnee Parker, ED Scribe. 11/07/16. 10:14 AM.   History   Chief Complaint Chief Complaint  Patient presents with  . Hand Pain   The history is provided by the patient. No language interpreter was used.   HPI Comments:  Gerald Parker is a 46 y.o. male who presents to the Emergency Department complaining of left ring finger pain x 1 week. Pt states his finger feels like it is locked in place. His pain is worse with movement of the finger. 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 Pt has been taking NSAIDS with no significant relief. Pt denies a hx of kidney problems, a hx of heart problems and any other associated symptoms at this time.   Past Medical History:  Diagnosis Date  . GERD (gastroesophageal reflux disease)   . Hypertension     Patient Active Problem List   Diagnosis Date Noted  . Sexual dysfunction 04/16/2016  . Scrotal pain 12/19/2013  . Numbness and tingling of foot 10/24/2013  . Strep pharyngitis 08/05/2013  . Hydrocele, right 06/28/2012  . INCOMPLETE VOIDING 01/30/2008  . DENTAL CARIES 10/31/2007  . HYPERTENSION, BENIGN 09/27/2007  . ALLERGIC RHINITIS 09/27/2007  . GERD 09/27/2007  . BENIGN PROSTATIC HYPERTROPHY, HX OF 09/27/2007    History reviewed. No pertinent surgical history.     Home Medications    Prior to Admission medications   Medication Sig Start Date End Date Taking? Authorizing Provider  albuterol (PROVENTIL HFA;VENTOLIN HFA) 108 (90 Base) MCG/ACT inhaler Inhale 1-2 puffs into the lungs every 6 (six) hours as needed for wheezing or shortness of breath. 02/26/16   [name], NP  amitriptyline (ELAVIL) 50 MG  tablet TAKE ONE TABLET BY MOUTH AT BEDTIME 10/18/16   [name], MD  cetirizine (ZYRTEC) 10 MG tablet Take 1 tablet (10 mg total) by mouth daily. Patient not taking: Reported on 01/27/2016 05/07/15   [name], NP  chlorpheniramine-HYDROcodone (TUSSIONEX) 10-8 MG/5ML LQCR Take 5 mLs by mouth every 12 (twelve) hours as needed for cough (and pain). 01/27/16   [name], MD  dextromethorphan-guaiFENesin (MUCINEX DM) 30-600 MG 12hr tablet Take 1 tablet by mouth 2 (two) times daily as needed for cough. 01/31/16   [name], PA-C  DM-Doxylamine-Acetaminophen (VICKS NYQUIL COLD & FLU PO) Take 15 mLs by mouth 2 (two) times daily as needed. For cold/flu symptoms    Historical Provider, MD  hydrOXYzine (ATARAX/VISTARIL) 10 MG tablet TAKE ONE TABLET BY MOUTH THREE TIMES DAILY AS NEEDED FOR  ITCHING 09/20/16   [name], MD  ibuprofen (ADVIL,MOTRIN) 800 MG tablet Take 1 tablet (800 mg total) by mouth every 8 (eight) hours as needed for fever or moderate pain. 01/31/16   Ace GinsSerena Y Sam, PA-C  lisinopril (PRINIVIL,ZESTRIL) 5 MG tablet TAKE ONE TABLET BY MOUTH ONCE DAILY 10/18/16   [name], MD    Family History Family History  Problem Relation Age of Onset  . Hypertension Mother   . Diabetes type II Mother   . Diabetes type II Father     Social History Social History  Substance Use Topics  . Smoking status: Never Smoker  .  Smokeless tobacco: Never Used  . Alcohol use No     Allergies   Patient has no known allergies.   Review of Systems Review of Systems  Constitutional: Negative for activity change.  Musculoskeletal: Positive for arthralgias and myalgias. Negative for back pain, gait problem, joint swelling and neck pain.  Skin: Negative for wound.  Neurological: Negative for weakness and numbness.     Physical Exam Updated Vital Signs BP 138/99 (BP Location: Right Arm)   Pulse 106   Temp 98 F (36.7 C) (Oral)   Resp 16   Ht 5\' 7"  (1.702 m)   Wt 175 lb (79.4  kg)   SpO2 100%   BMI 27.41 kg/m   Physical Exam  Constitutional: He is oriented to person, place, and time. He appears well-developed and well-nourished. No distress.  HENT:  Head: Normocephalic and atraumatic.  Eyes: Conjunctivae are normal.  Neck: Normal range of motion. Neck supple.  Cardiovascular: Normal rate and normal pulses.  Exam reveals no decreased pulses.   Pulmonary/Chest: Effort normal.  Abdominal: He exhibits no distension.  Musculoskeletal: He exhibits tenderness. He exhibits no edema.  Tenderness on palm at L ring MCP. Patient able to flex/extend finger but it catches with extension. < 2s cap refill.   Neurological: He is alert and oriented to person, place, and time. No sensory deficit.  Motor, sensation, and vascular distal to the injury is fully intact.   Skin: Skin is warm and dry.  Psychiatric: He has a normal mood and affect.  Nursing note and vitals reviewed.    ED Treatments / Results  DIAGNOSTIC STUDIES:  Oxygen Saturation is 100% on RA, normal by my interpretation.    COORDINATION OF CARE:  10:10 AM Will prescribe Naproxen and order a finger splint. Discussed treatment plan with pt at bedside and pt agreed to plan.  Procedures Procedures (including critical care time)  Medications Ordered in ED Medications - No data to display   Initial Impression / Assessment and Plan / ED Course  I have reviewed the triage vital signs and the nursing notes.  Pertinent labs & imaging results that were available during my care of the patient were reviewed by me and considered in my medical decision making (see chart for details).     Patient given finger splint while in ED, conservative therapy recommended and discussed. Patient will be discharged home & is agreeable with above plan. Returns precautions discussed. Pt appears safe for discharge.   Final Clinical Impressions(s) / ED Diagnoses   Final diagnoses:  Trigger ring finger of left hand    Patient with stenosing flexor tenosynovitis. Will start basic therapy and have him f/u with hand surgery. Finger is neurovascularly intact.    New Prescriptions Discharge Medication List as of 11/07/2016 10:19 AM    START taking these medications   Details  naproxen (NAPROSYN) 500 MG tablet Take 1 tablet (500 mg total) by mouth 2 (two) times daily., Starting Tue 11/07/2016, Print      I personally performed the services described in this documentation, which was scribed in my presence. The recorded information has been reviewed and is accurate.     Renne Crigler, PA-C 11/07/16 1041    Doug Sou, MD 11/07/16 1540

## 2016-11-07 NOTE — ED Triage Notes (Signed)
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

## 2016-11-22 ENCOUNTER — Other Ambulatory Visit: Payer: Self-pay | Admitting: Family Medicine

## 2016-11-22 DIAGNOSIS — I10 Essential (primary) hypertension: Secondary | ICD-10-CM

## 2016-12-25 ENCOUNTER — Other Ambulatory Visit: Payer: Self-pay | Admitting: Family Medicine

## 2016-12-25 DIAGNOSIS — I10 Essential (primary) hypertension: Secondary | ICD-10-CM

## 2017-01-20 ENCOUNTER — Other Ambulatory Visit: Payer: Self-pay | Admitting: Family Medicine

## 2017-01-20 DIAGNOSIS — I10 Essential (primary) hypertension: Secondary | ICD-10-CM

## 2017-02-13 ENCOUNTER — Other Ambulatory Visit: Payer: Self-pay | Admitting: Family Medicine

## 2017-02-13 DIAGNOSIS — I10 Essential (primary) hypertension: Secondary | ICD-10-CM

## 2017-03-24 ENCOUNTER — Other Ambulatory Visit: Payer: Self-pay | Admitting: Family Medicine

## 2017-03-24 DIAGNOSIS — I10 Essential (primary) hypertension: Secondary | ICD-10-CM

## 2017-04-19 IMAGING — CR DG CHEST 2V
2 series · 2 of 2 positions shown · non-contrast
Comparison: August 13, 2013

CLINICAL DATA: Flu like symptoms.  Cough.

EXAM:
CHEST  2 VIEW

[chest pa]
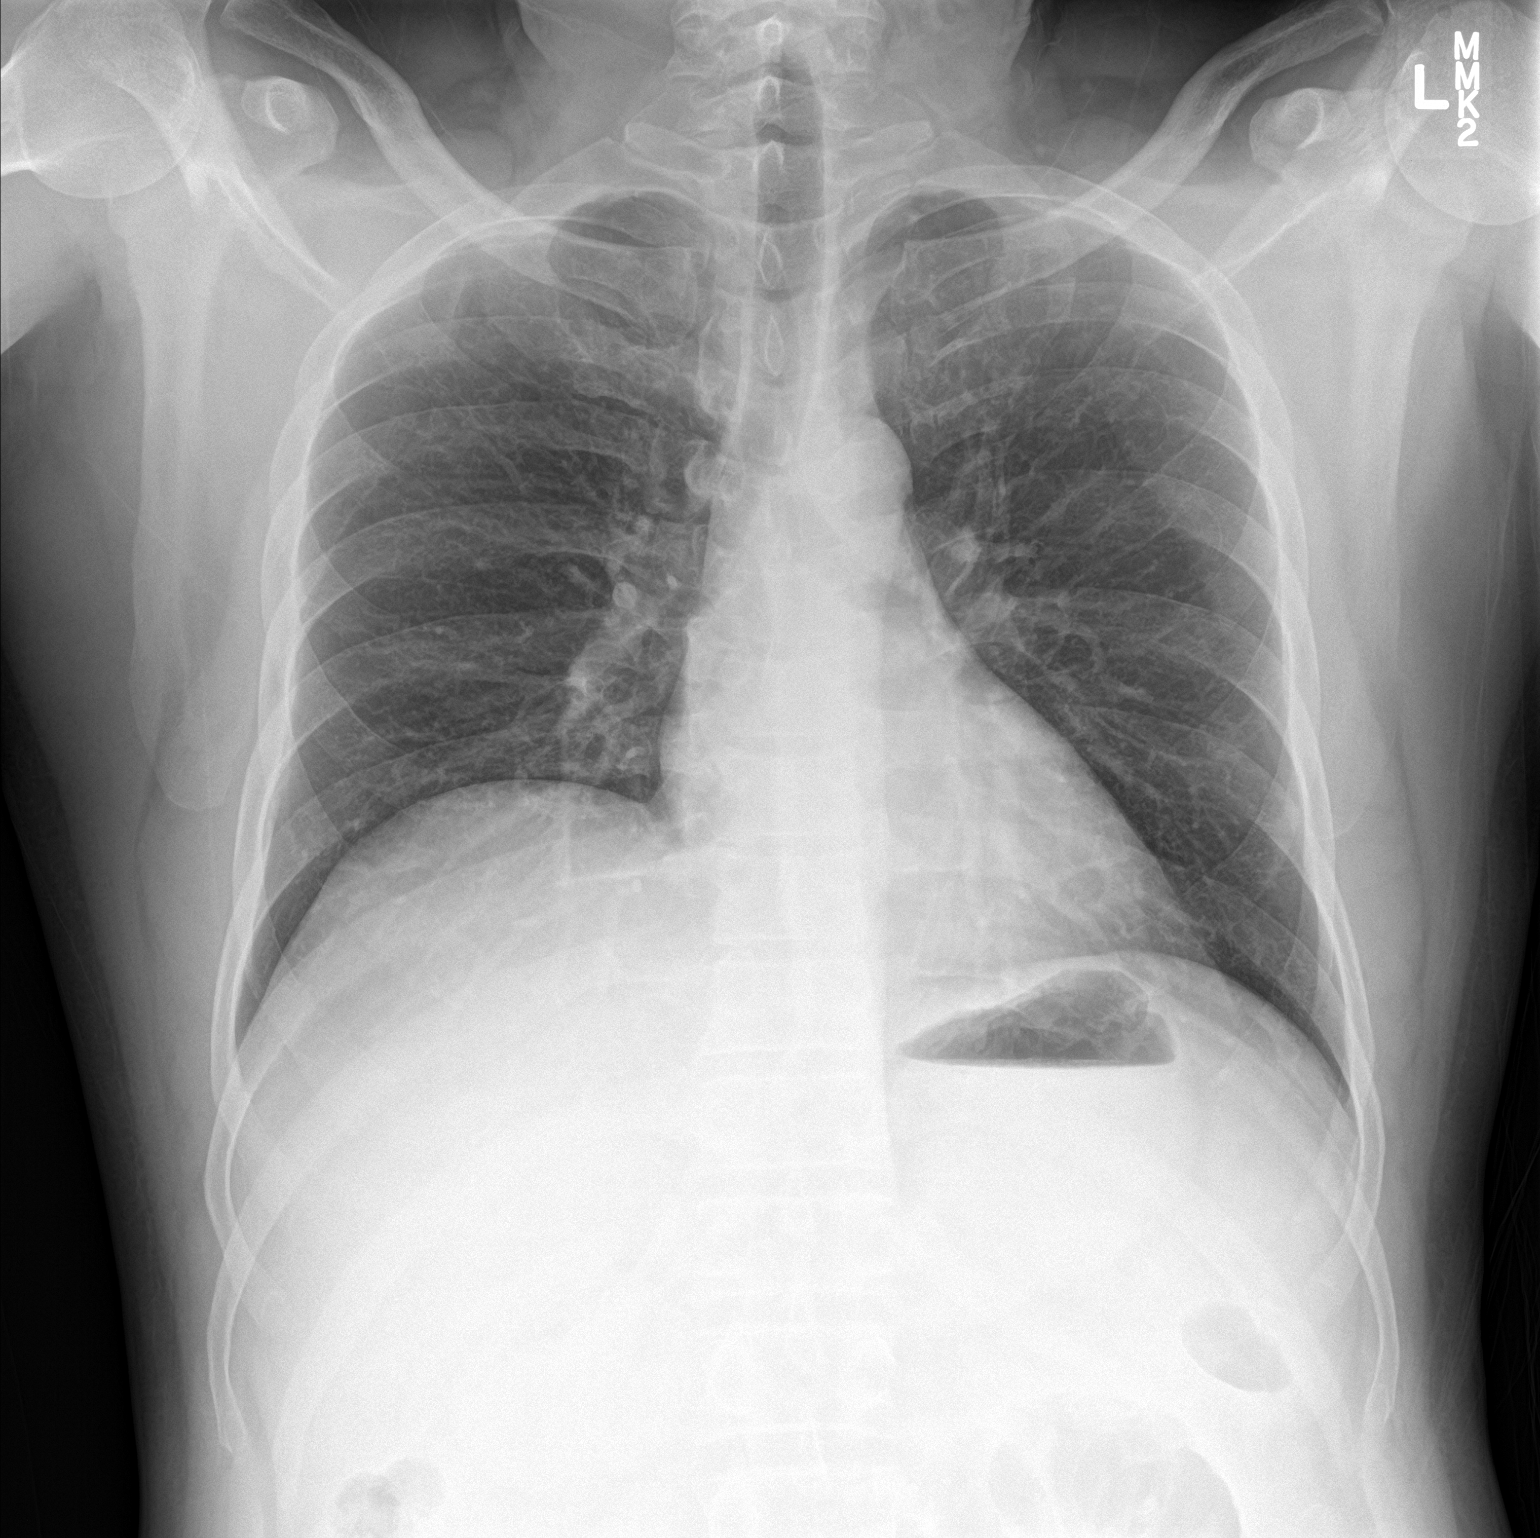

[chest lat]
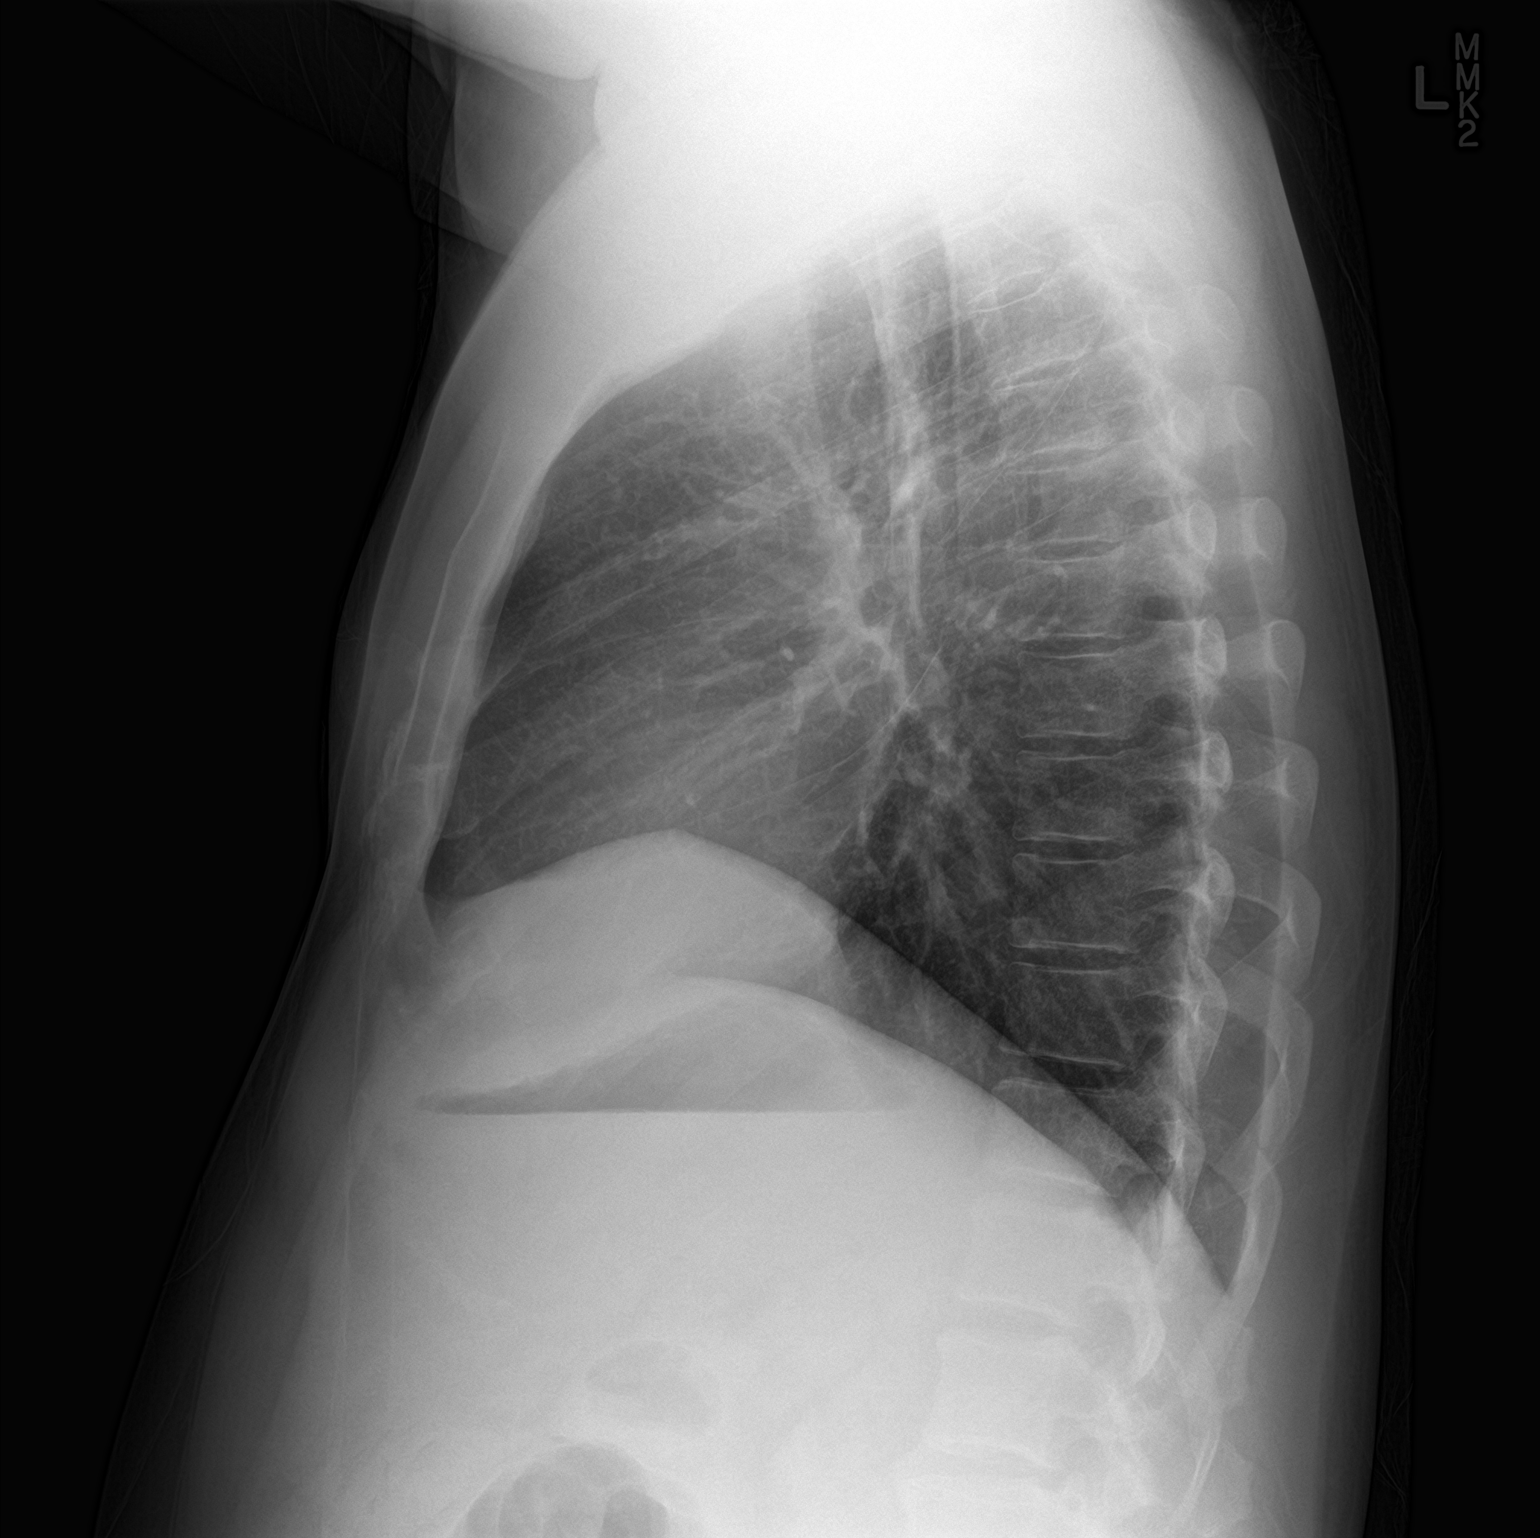

[2 of 2 positions shown; findings below may reference images not displayed]

FINDINGS: Sign rib
IMPRESSION: No active cardiopulmonary disease.

## 2017-05-21 ENCOUNTER — Other Ambulatory Visit: Payer: Self-pay | Admitting: Family Medicine

## 2017-05-21 DIAGNOSIS — R202 Paresthesia of skin: Principal | ICD-10-CM

## 2017-05-21 DIAGNOSIS — I10 Essential (primary) hypertension: Secondary | ICD-10-CM

## 2017-05-21 DIAGNOSIS — R200 Anesthesia of skin: Secondary | ICD-10-CM

## 2017-05-22 IMAGING — DX DG CHEST 2V
2 series · 2 of 2 positions shown · non-contrast
Comparison: 12/25/2015

CLINICAL DATA: Cough and fever for few days

EXAM:
CHEST  2 VIEW

[w chest pa]
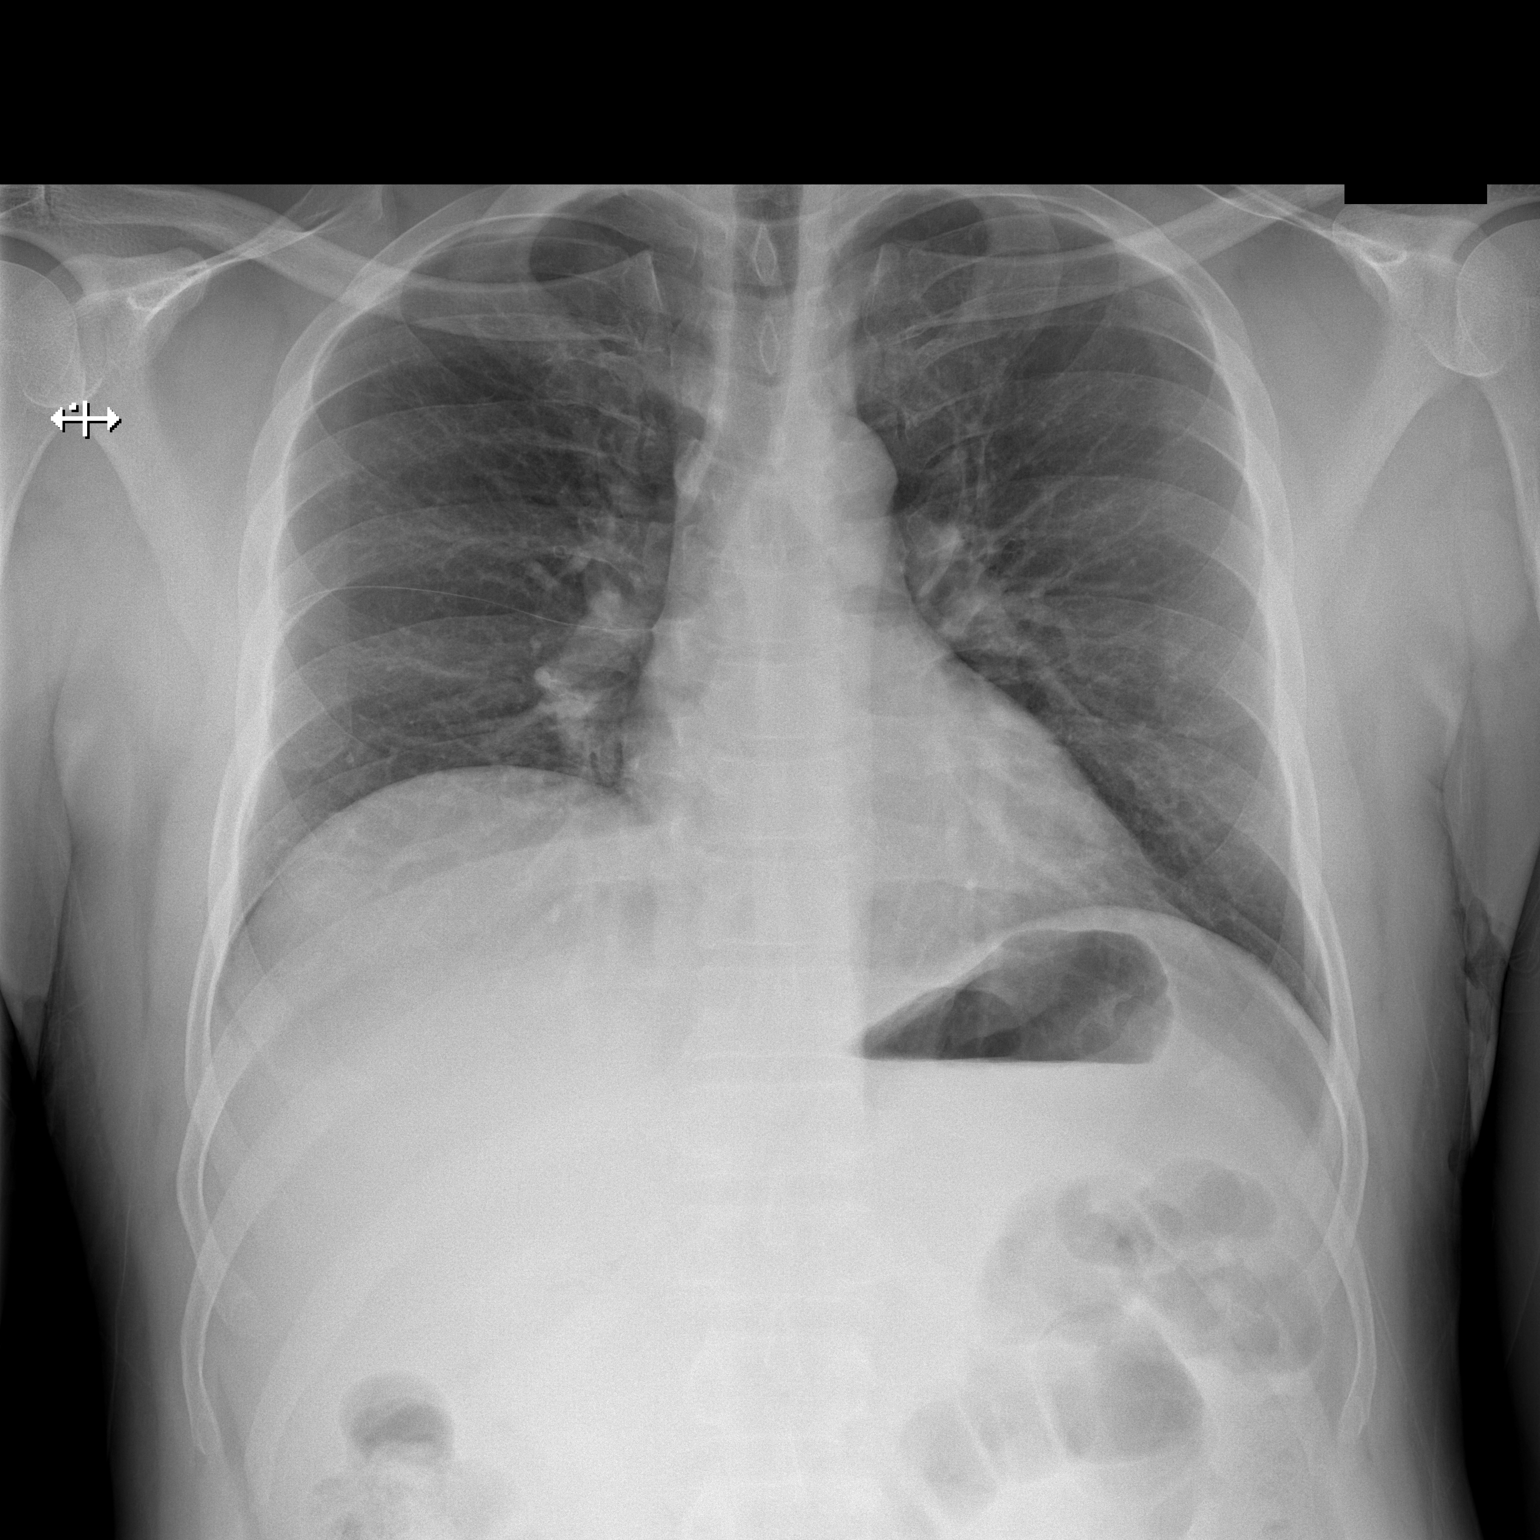

[w chest lat]
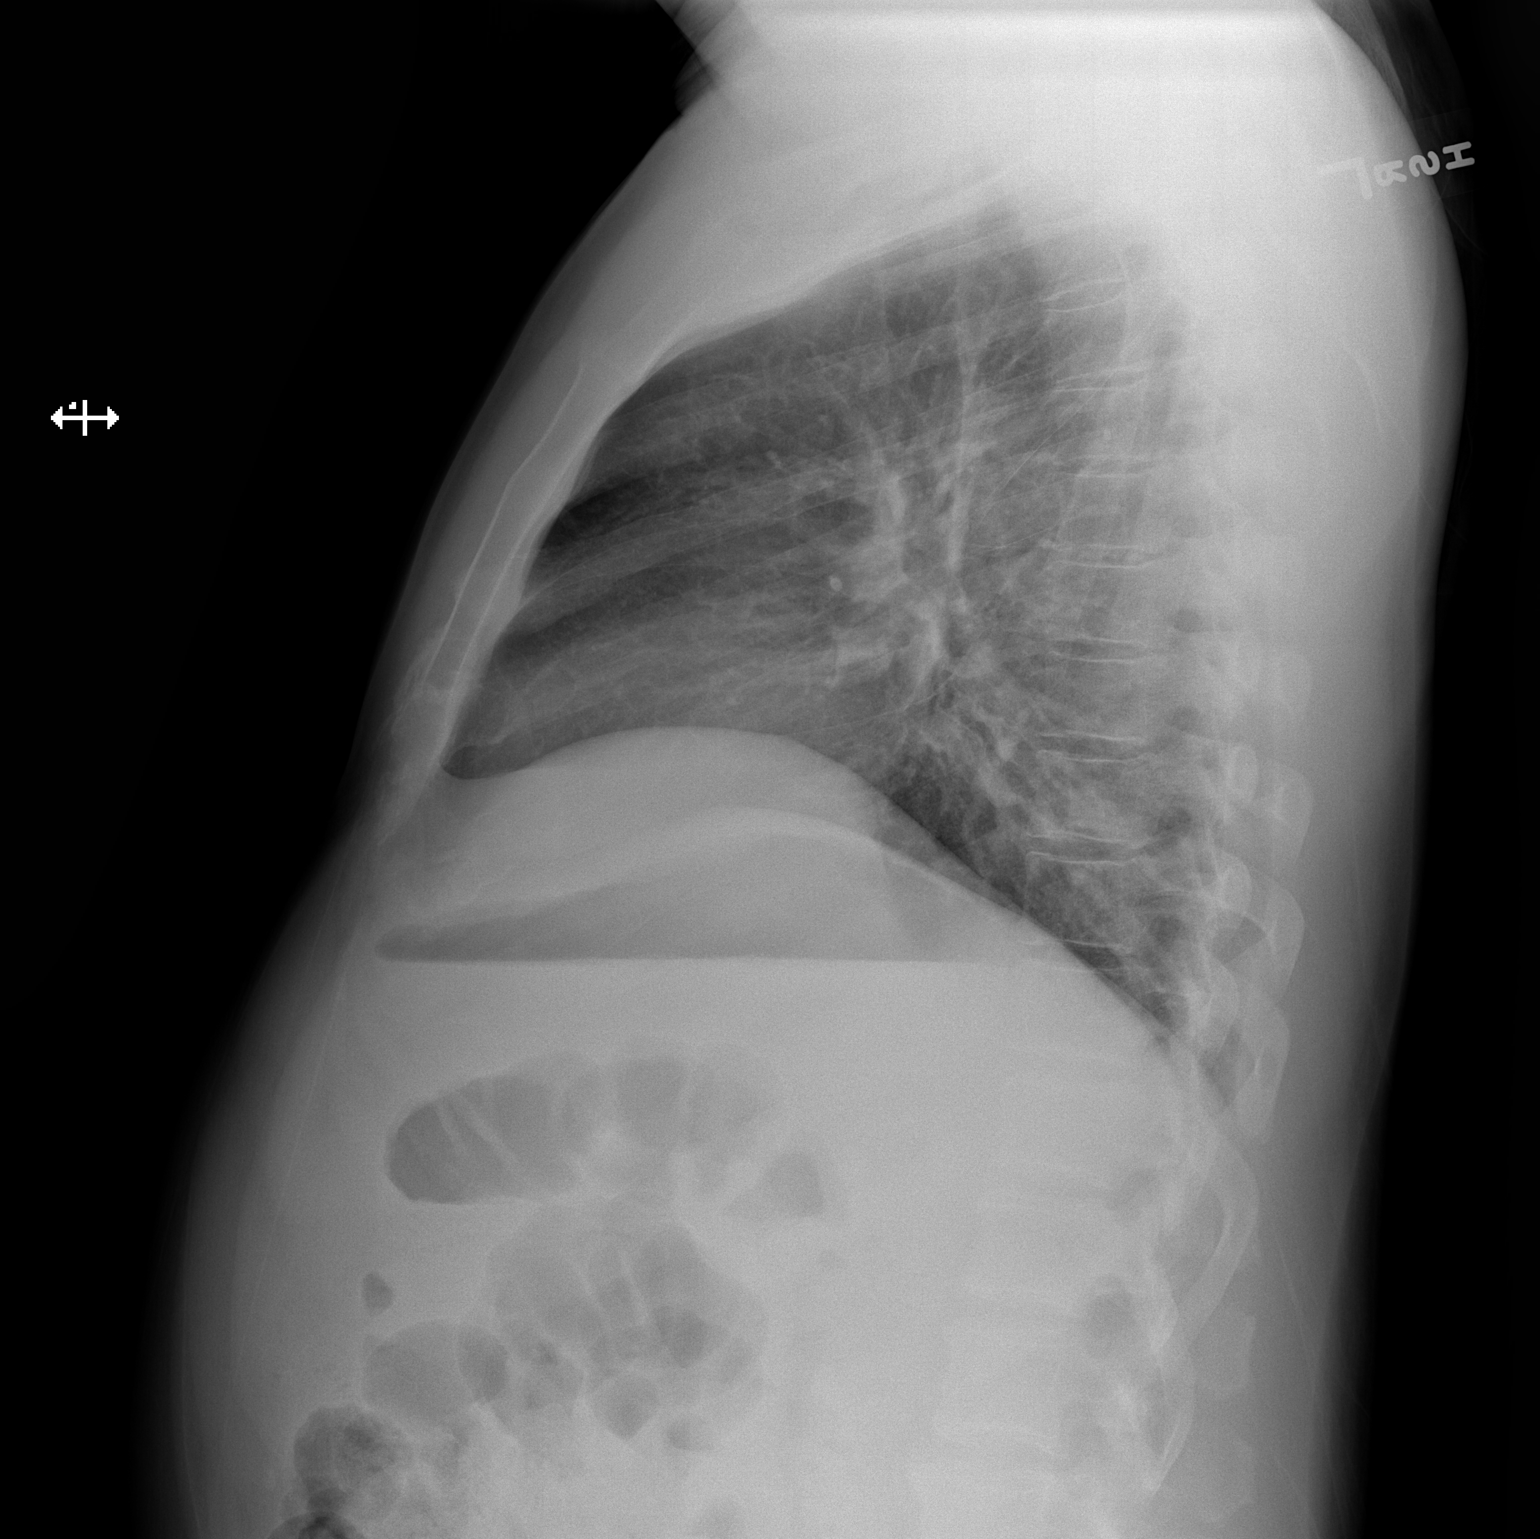

[2 of 2 positions shown; findings below may reference images not displayed]

FINDINGS: Cardiomediastinal silhouette is stable. No acute infiltrate or
pleural effusion. No pulmonary edema. Bony thorax is unremarkable.
IMPRESSION: No active cardiopulmonary disease.

## 2017-06-19 ENCOUNTER — Other Ambulatory Visit: Payer: Self-pay | Admitting: Student

## 2017-06-19 DIAGNOSIS — R200 Anesthesia of skin: Secondary | ICD-10-CM

## 2017-06-19 DIAGNOSIS — R202 Paresthesia of skin: Principal | ICD-10-CM

## 2017-06-19 DIAGNOSIS — I10 Essential (primary) hypertension: Secondary | ICD-10-CM

## 2017-06-21 IMAGING — DX DG CHEST 2V
2 series · 2 of 2 positions shown · non-contrast
Comparison: 01/27/2016 and 12/25/2015

CLINICAL DATA: Chest pain, cough and fever four days.

EXAM:
CHEST  2 VIEW

[chest pa]
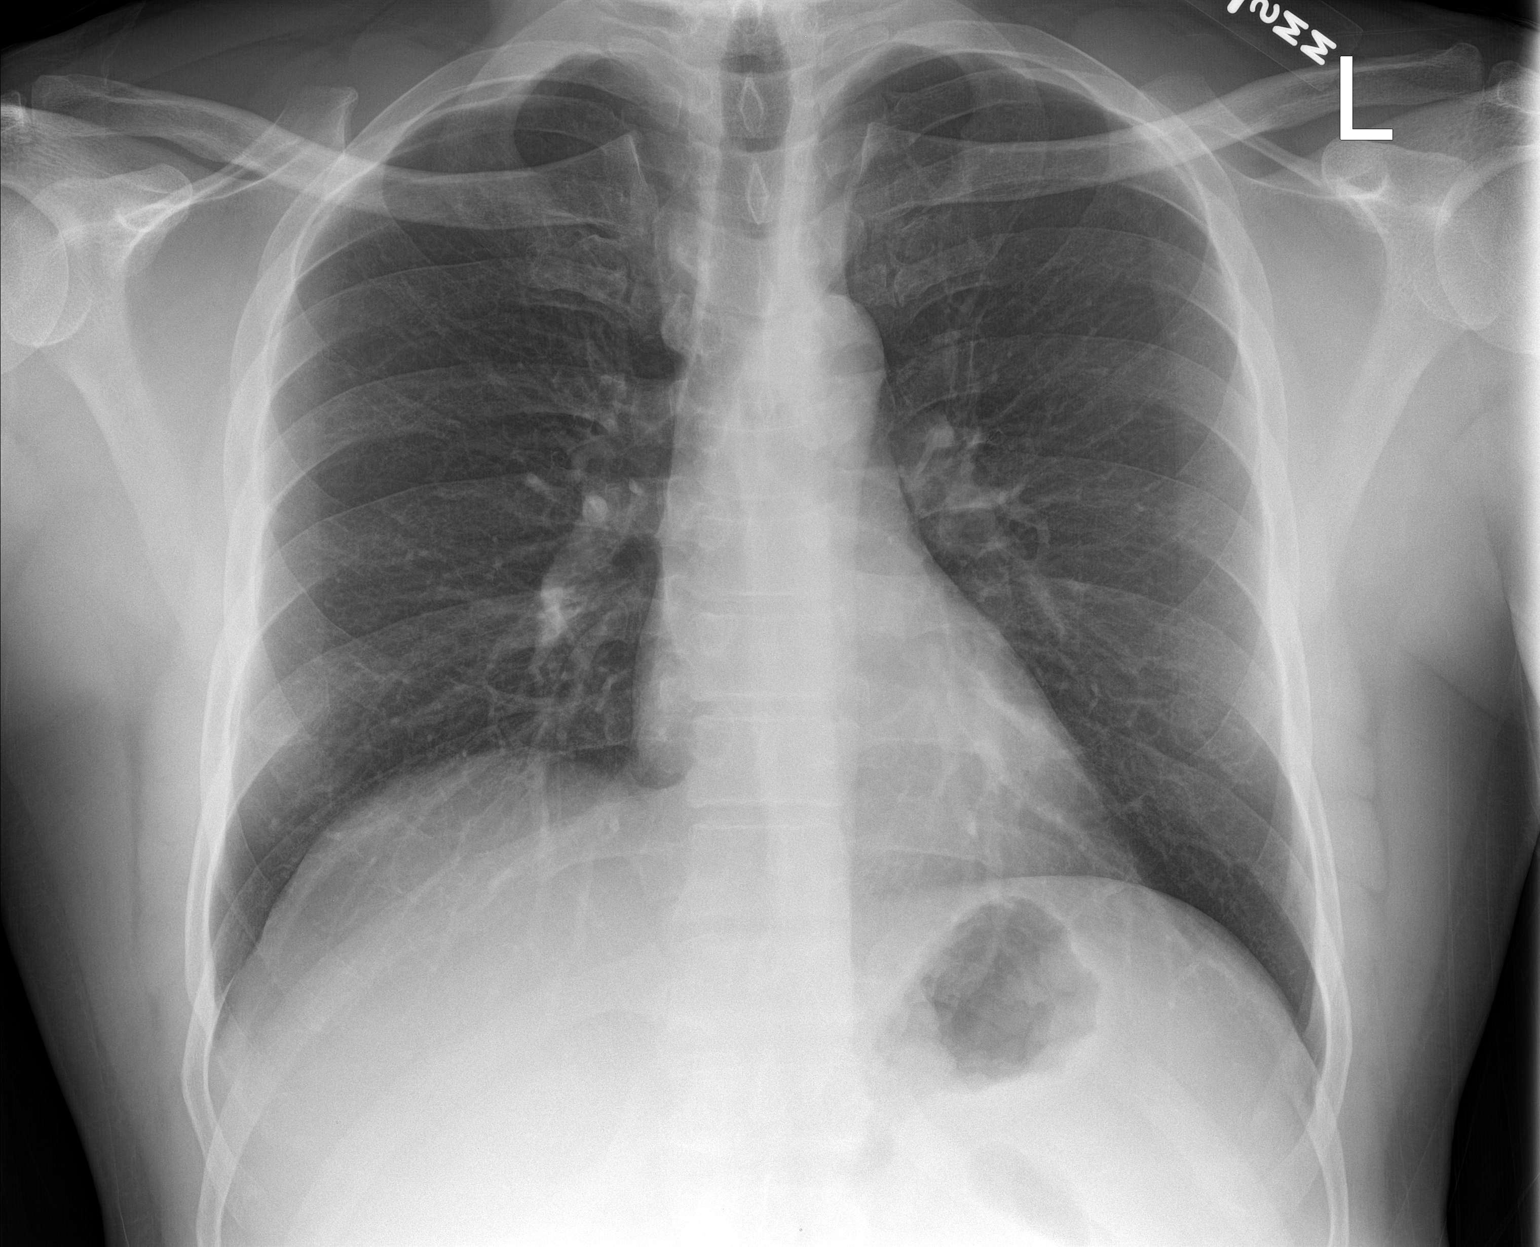

[chest lat]
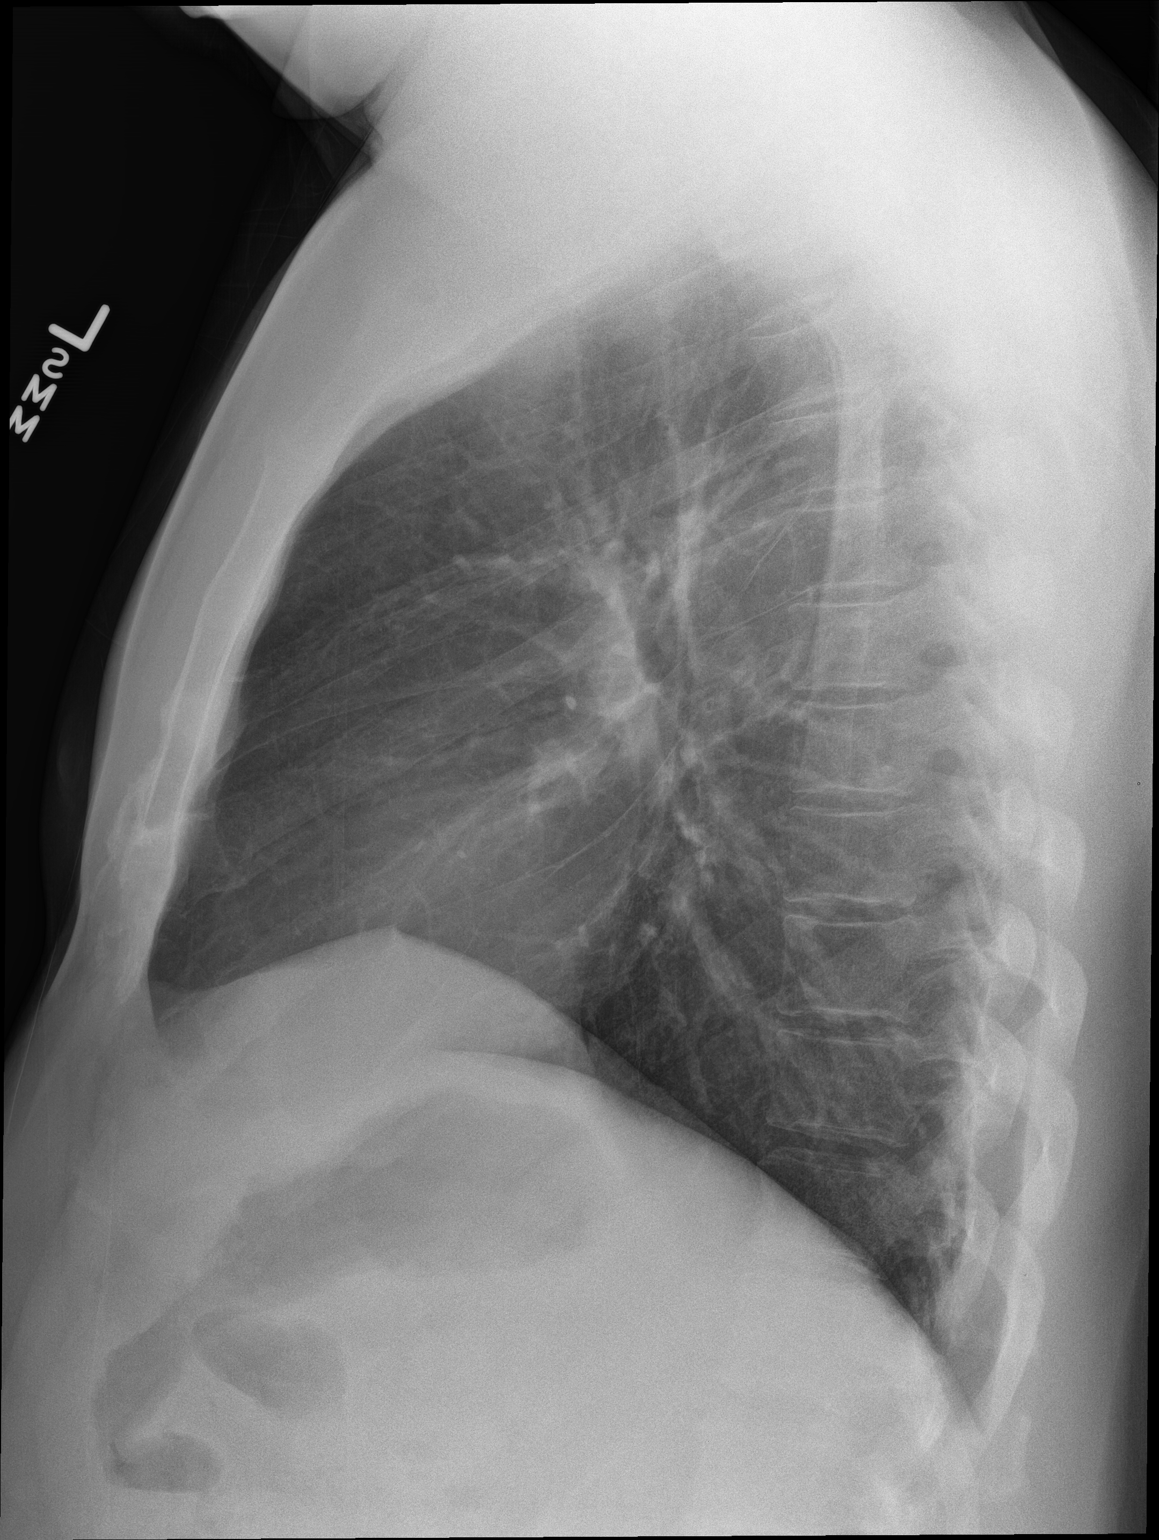

[2 of 2 positions shown; findings below may reference images not displayed]

FINDINGS: Lungs are adequately inflated and otherwise clear. Cardiomediastinal
silhouette, bones and soft tissues are within normal.
IMPRESSION: No active cardiopulmonary disease.

## 2017-08-07 ENCOUNTER — Telehealth: Payer: Self-pay | Admitting: Family Medicine

## 2017-08-07 ENCOUNTER — Other Ambulatory Visit: Payer: Self-pay | Admitting: Family Medicine

## 2017-08-07 DIAGNOSIS — R202 Paresthesia of skin: Principal | ICD-10-CM

## 2017-08-07 DIAGNOSIS — R200 Anesthesia of skin: Secondary | ICD-10-CM

## 2017-08-07 MED ORDER — AMITRIPTYLINE HCL 50 MG PO TABS: ORAL_TABLET | ORAL | 0 refills | Status: DC

## 2017-08-07 NOTE — Telephone Encounter (Signed)
LVM to call office back to see which medication he is needing refilled.  If pt calls back please get this information so we can send to doctor. Gerald SakaiZimmerman Parker, Gerald Parker D, [place]

## 2017-08-07 NOTE — Telephone Encounter (Signed)
Happy to refill, but he needs to specify what he needs.  Please call to ask.  Thanks, Rande Bruntaniel L. Myrtie SomanWarden, MD [facility] Resident PGY-2 08/07/2017 1:10 PM

## 2017-08-07 NOTE — Telephone Encounter (Signed)
Pt came into office and had this Rx filled via paper script that he was requesting. Lamonte SakaiZimmerman Rumple, April D, [place]

## 2017-08-07 NOTE — Telephone Encounter (Signed)
Pt has an appt tues oct 30 for med refills. He would like to get some meds to last him until tues. 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 He would like to have this done ASAP. Please advise

## 2017-08-09 ENCOUNTER — Telehealth: Payer: Self-pay

## 2017-08-10 ENCOUNTER — Encounter: Payer: Self-pay | Admitting: Family Medicine

## 2017-08-10 ENCOUNTER — Ambulatory Visit (INDEPENDENT_AMBULATORY_CARE_PROVIDER_SITE_OTHER): Payer: Medicaid Other | Admitting: Family Medicine

## 2017-08-10 VITALS — BP 116/80 | HR 82 | Temp 97.9°F | Ht 68.0 in | Wt 193.0 lb

## 2017-08-10 DIAGNOSIS — R739 Hyperglycemia, unspecified: Secondary | ICD-10-CM | POA: Diagnosis not present

## 2017-08-10 DIAGNOSIS — I10 Essential (primary) hypertension: Secondary | ICD-10-CM

## 2017-08-10 DIAGNOSIS — E785 Hyperlipidemia, unspecified: Secondary | ICD-10-CM

## 2017-08-10 LAB — POCT GLYCOSYLATED HEMOGLOBIN (HGB A1C): HEMOGLOBIN A1C: 5.5

## 2017-08-10 MED ORDER — LISINOPRIL 5 MG PO TABS: ORAL_TABLET | ORAL | 3 refills | Status: DC

## 2017-08-10 NOTE — Patient Instructions (Signed)
Gerald Parker, you were seen today for medication update. I also checked your kidneys, cholesterol and checked you for diabetes.  I will call you if any of these are abnormal.  I highly recommend that you do get your tetanus and flu-shots in the near future.   Very nice to see you!  Kenly Xiao L. Myrtie SomanWarden, MD [facility] Resident PGY-2 08/10/2017 12:18 PM

## 2017-08-11 LAB — CMP14+EGFR
ALK PHOS: 72 IU/L (ref 39–117)
ALT: 20 IU/L (ref 0–44)
AST: 16 IU/L (ref 0–40)
Albumin/Globulin Ratio: 1.4 (ref 1.2–2.2)
Albumin: 4.3 g/dL (ref 3.5–5.5)
BUN/Creatinine Ratio: 10 (ref 9–20)
BUN: 9 mg/dL (ref 6–24)
Bilirubin Total: <0.2 mg/dL (ref 0.0–1.2)
CHLORIDE: 99 mmol/L (ref 96–106)
CO2: 25 mmol/L (ref 20–29)
CREATININE: 0.89 mg/dL (ref 0.76–1.27)
Calcium: 9.5 mg/dL (ref 8.7–10.2)
GFR calc Af Amer: 118 mL/min/{1.73_m2} (ref >59)
GFR calc non Af Amer: 102 mL/min/{1.73_m2} (ref >59)
GLOBULIN, TOTAL: 3 g/dL (ref 1.5–4.5)
GLUCOSE: 97 mg/dL (ref 65–99)
Potassium: 4.7 mmol/L (ref 3.5–5.2)
SODIUM: 137 mmol/L (ref 134–144)
Total Protein: 7.3 g/dL (ref 6.0–8.5)

## 2017-08-11 LAB — LIPID PANEL
CHOLESTEROL TOTAL: 230 mg/dL — AB (ref 100–199)
Chol/HDL Ratio: 5.8 ratio — ABNORMAL HIGH (ref 0.0–5.0)
HDL: 40 mg/dL (ref >39)
Triglycerides: 441 mg/dL — ABNORMAL HIGH (ref 0–149)

## 2017-08-13 DIAGNOSIS — E785 Hyperlipidemia, unspecified: Secondary | ICD-10-CM | POA: Insufficient documentation

## 2017-08-13 NOTE — Progress Notes (Signed)
Subjective:  Gerald Parker is a 46 y.o. male who presents to the [facility] today to refill medication.   HPI:  Hypertension Blood pressure at home: does not take Blood pressure today: 116/80 Meds: Compliant with lisinopril Side effects: none ROS: Denies headache, dizziness, visual changes, nausea, vomiting, chest pain, abdominal pain or shortness of breath.  PMH: HTN Tobacco use: none Medication: reviewed and updated ROS: see HPI   Objective:  Physical Exam: BP 116/80   Pulse 82   Temp 97.9 F (36.6 C) (Oral)   Ht '5\' 8"'  (1.727 m)   Wt 193 lb (87.5 kg)   SpO2 97%   BMI 29.35 kg/m   Gen: 46yo M in NAD, resting comfortably CV: RRR with no murmurs appreciated Pulm: NWOB, CTAB with no crackles, wheezes, or rhonchi GI: Normal bowel sounds present. Soft, Nontender, Nondistended. MSK: no edema, cyanosis, or clubbing noted Skin: warm, dry Neuro: grossly normal, moves all extremities Psych: Normal affect and thought content  Results for orders placed or performed in visit on 08/10/17 (from the past 72 hour(s))  POCT A1C     Status: None   Collection Time: 08/10/17 12:04 PM  Result Value Ref Range   Hemoglobin A1C 5.5   CMP14+EGFR     Status: None   Collection Time: 08/10/17 12:10 PM  Result Value Ref Range   Glucose 97 65 - 99 mg/dL   BUN 9 6 - 24 mg/dL   Creatinine, Ser 0.89 0.76 - 1.27 mg/dL   GFR calc non Af Amer 102 >59 mL/min/1.73   GFR calc Af Amer 118 >59 mL/min/1.73   BUN/Creatinine Ratio 10 9 - 20   Sodium 137 134 - 144 mmol/L   Potassium 4.7 3.5 - 5.2 mmol/L   Chloride 99 96 - 106 mmol/L   CO2 25 20 - 29 mmol/L   Calcium 9.5 8.7 - 10.2 mg/dL   Total Protein 7.3 6.0 - 8.5 g/dL   Albumin 4.3 3.5 - 5.5 g/dL   Globulin, Total 3.0 1.5 - 4.5 g/dL   Albumin/Globulin Ratio 1.4 1.2 - 2.2   Bilirubin Total <0.2 0.0 - 1.2 mg/dL   Alkaline Phosphatase 72 39 - 117 IU/L   AST 16 0 - 40 IU/L   ALT 20 0 - 44 IU/L  Lipid panel     Status: Abnormal   Collection Time:  08/10/17 12:10 PM  Result Value Ref Range   Cholesterol, Total 230 (H) 100 - 199 mg/dL   Triglycerides 441 (H) 0 - 149 mg/dL   HDL 40 >39 mg/dL   VLDL Cholesterol Cal Comment 5 - 40 mg/dL    Comment: The calculation for the VLDL cholesterol is not valid when triglyceride level is >400 mg/dL.    LDL Calculated Comment 0 - 99 mg/dL    Comment: Triglyceride result indicated is too high for an accurate LDL cholesterol estimation.    Chol/HDL Ratio 5.8 (H) 0.0 - 5.0 ratio    Comment:                                   T. Chol/HDL Ratio                                             Men  Women  1/2 Avg.Risk  3.4    3.3                                   Avg.Risk  5.0    4.4                                2X Avg.Risk  9.6    7.1                                3X Avg.Risk 23.4   11.0      Assessment/Plan:  HYPERTENSION, BENIGN Compliant with lisinopril. No complications and no side effects with medication. Checked CMP today and creatinine is WNL. Will continue on current regimen.   Hyperlipidemia Elevated triglycerides in the 400s and cholesterol elevated to 230.  ASCVD risk 7%.  Call patient to discuss results and recommendation to start Lipitor 40 mg daily.  He agreed.  CMP checked during this visit demonstrated normal LFTs.  Will recheck LDL in 6 months.   Healthcare maintenance Declined flu shot and Tdap today.  Discussed risks and benefits and highly recommended the patient reconsider this in the future.  Hemoglobin A1C WNL.   Akayla Brass L. Rosalyn Gess, [place] Resident PGY-2 08/13/2017 10:02 AM

## 2017-08-13 NOTE — Assessment & Plan Note (Signed)
Elevated triglycerides in the 400s and cholesterol elevated to 230.  ASCVD risk 7%.  Call patient to discuss results and recommendation to start Lipitor 40 mg daily.  He agreed.  CMP checked during this visit demonstrated normal LFTs.  Will recheck LDL in 6 months.

## 2017-08-13 NOTE — Assessment & Plan Note (Addendum)
Compliant with lisinopril. No complications and no side effects with medication. Checked CMP today and creatinine is WNL. Will continue on current regimen.

## 2017-08-14 ENCOUNTER — Ambulatory Visit: Payer: Self-pay | Admitting: Family Medicine

## 2017-08-20 ENCOUNTER — Ambulatory Visit: Payer: Self-pay | Admitting: Student in an Organized Health Care Education/Training Program

## 2017-09-08 ENCOUNTER — Other Ambulatory Visit: Payer: Self-pay | Admitting: Family Medicine

## 2017-09-08 DIAGNOSIS — R202 Paresthesia of skin: Principal | ICD-10-CM

## 2017-09-08 DIAGNOSIS — R200 Anesthesia of skin: Secondary | ICD-10-CM

## 2017-10-08 ENCOUNTER — Other Ambulatory Visit: Payer: Self-pay | Admitting: Family Medicine

## 2017-10-08 DIAGNOSIS — R200 Anesthesia of skin: Secondary | ICD-10-CM

## 2017-10-08 DIAGNOSIS — R202 Paresthesia of skin: Principal | ICD-10-CM

## 2017-10-29 NOTE — Telephone Encounter (Signed)
Open encounter clean up.Simpson, Michelle R, CMA   

## 2017-11-09 ENCOUNTER — Other Ambulatory Visit: Payer: Self-pay | Admitting: Family Medicine

## 2017-11-09 DIAGNOSIS — R202 Paresthesia of skin: Principal | ICD-10-CM

## 2017-11-09 DIAGNOSIS — R200 Anesthesia of skin: Secondary | ICD-10-CM

## 2017-12-14 ENCOUNTER — Telehealth: Payer: Self-pay

## 2017-12-14 DIAGNOSIS — R200 Anesthesia of skin: Secondary | ICD-10-CM

## 2017-12-14 DIAGNOSIS — R202 Paresthesia of skin: Principal | ICD-10-CM

## 2017-12-17 ENCOUNTER — Other Ambulatory Visit: Payer: Self-pay

## 2017-12-17 ENCOUNTER — Telehealth: Payer: Self-pay | Admitting: Student in an Organized Health Care Education/Training Program

## 2017-12-17 DIAGNOSIS — R202 Paresthesia of skin: Principal | ICD-10-CM

## 2017-12-17 DIAGNOSIS — R200 Anesthesia of skin: Secondary | ICD-10-CM

## 2017-12-17 MED ORDER — AMITRIPTYLINE HCL 50 MG PO TABS: 50.0000 mg | ORAL_TABLET | Freq: Every evening | ORAL | 0 refills | Status: DC | PRN

## 2017-12-17 NOTE — Telephone Encounter (Signed)
I sent in a month's refill

## 2017-12-17 NOTE — Telephone Encounter (Signed)
Patient wants to know if he can get a refill asap today for his Amitriptyline.  He has an appt on Friday this week.  [facility] [address]  thanks

## 2017-12-17 NOTE — Telephone Encounter (Signed)
Pt calling regarding amitriptyline. [facility] is too expensive to have filled there. [facility] [address] [place]. Shawna OrleansMeredith B Thomsen, RN

## 2017-12-17 NOTE — Telephone Encounter (Signed)
[facility] [address] of the [facility] we used to call in his medication today. They are to expensive. jw

## 2017-12-18 NOTE — Telephone Encounter (Signed)
Completed by Dr. Mosetta PuttFeng last night. Gerald Parker, Gerald RochesterJessica Parker, CMA

## 2017-12-21 ENCOUNTER — Other Ambulatory Visit: Payer: Self-pay

## 2017-12-21 ENCOUNTER — Ambulatory Visit (INDEPENDENT_AMBULATORY_CARE_PROVIDER_SITE_OTHER): Payer: Self-pay | Admitting: Family Medicine

## 2017-12-21 ENCOUNTER — Encounter: Payer: Self-pay | Admitting: Family Medicine

## 2017-12-21 DIAGNOSIS — N400 Enlarged prostate without lower urinary tract symptoms: Secondary | ICD-10-CM

## 2017-12-21 DIAGNOSIS — R200 Anesthesia of skin: Secondary | ICD-10-CM

## 2017-12-21 DIAGNOSIS — R202 Paresthesia of skin: Secondary | ICD-10-CM

## 2017-12-21 MED ORDER — AMITRIPTYLINE HCL 50 MG PO TABS: 50.0000 mg | ORAL_TABLET | Freq: Every evening | ORAL | 0 refills | Status: DC | PRN

## 2017-12-21 NOTE — Patient Instructions (Addendum)
Thank you for coming in today, it was so nice to see you! Today we talked about:    We have refilled your medication today. You will need to schedule an appointment with the urologist.   Please follow up in 6 months for your blood pressure.   If you have any questions or concerns, please do not hesitate to call the office at [phone]. You can also message me directly via MyChart.   Sincerely,  Anders Simmondshristina Cleveland Yarbro, MD

## 2017-12-21 NOTE — Progress Notes (Signed)
   Subjective:    Patient ID: Gerald Parker , male   DOB: 10/05/1971 , 47 y.o..   MRN: [id number]  HPI  Gerald Parker is here for  Chief Complaint  Patient presents with  . Medication Refill    1. Medication Refill: Patient reports that he needs a refill on his amitriptyline.  He notes that he has prescribed amitriptyline for his prostate pain.  He notes that he follows with a urologist for this.  He does not have an appointment coming up with his urologist.  He denies any dysuria, flank pain, increased urinary frequency.  Review of Systems: Per HPI.    Past Medical History: Patient Active Problem List   Diagnosis Date Noted  . Hyperlipidemia 08/13/2017  . Sexual dysfunction 04/16/2016  . Scrotal pain 12/19/2013  . Numbness and tingling of foot 10/24/2013  . Hydrocele, right 06/28/2012  . INCOMPLETE VOIDING 01/30/2008  . DENTAL CARIES 10/31/2007  . HYPERTENSION, BENIGN 09/27/2007  . ALLERGIC RHINITIS 09/27/2007  . GERD 09/27/2007  . BPH (benign prostatic hyperplasia) 09/27/2007    Medications: reviewed   Social Hx:  reports that  has never smoked. he has never used smokeless tobacco.   Objective:   BP 122/86   Pulse 80   Temp 98.6 F (37 C) (Oral)   Ht 5\' 8"  (1.727 m)   Wt 195 lb 9.6 oz (88.7 kg)   SpO2 99%   BMI 29.74 kg/m  Physical Exam  Gen: NAD, alert, cooperative with exam, well-appearing Cardiac: Regular rate and rhythm, normal S1/S2, no murmur, no edema, capillary refill brisk  Respiratory: Clear to auscultation bilaterally, no wheezes, non-labored breathing Psych: good insight, normal mood and affect  Assessment & Plan:  BPH (benign prostatic hyperplasia) Patient with history of BPH.  Denies any urinary symptoms at this time.  Notes that he was prescribed amitriptyline before for prostate pain, unclear why.  He does have a history of numbness and tingling of his feet which would explain the prescription for amitriptyline but he denies having any  symptoms.  Refilled the amitriptyline for him and advised him to follow-up with the urologist as soon as possible.   Meds ordered this encounter  Medications  . amitriptyline (ELAVIL) 50 MG tablet    Sig: Take 1 tablet (50 mg total) by mouth at bedtime as needed for sleep.    Dispense:  90 tablet    Refill:  0    Please consider 90 day supplies to promote better adherence    Anders Simmondshristina Million Maharaj, MD [facility], PGY-3

## 2017-12-30 NOTE — Assessment & Plan Note (Signed)
Patient with history of BPH.  Denies any urinary symptoms at this time.  Notes that he was prescribed amitriptyline before for prostate pain, unclear why.  He does have a history of numbness and tingling of his feet which would explain the prescription for amitriptyline but he denies having any symptoms.  Refilled the amitriptyline for him and advised him to follow-up with the urologist as soon as possible.

## 2018-03-24 ENCOUNTER — Other Ambulatory Visit: Payer: Self-pay | Admitting: Family Medicine

## 2018-03-24 DIAGNOSIS — R202 Paresthesia of skin: Principal | ICD-10-CM

## 2018-03-24 DIAGNOSIS — R200 Anesthesia of skin: Secondary | ICD-10-CM

## 2018-05-13 ENCOUNTER — Telehealth: Payer: Self-pay | Admitting: Student in an Organized Health Care Education/Training Program

## 2018-05-13 NOTE — Telephone Encounter (Signed)
Patient came to office stated he needs to get extra refills on his RX's due to [place] is going [place] of the [place] and will not be back until the end of Oct.  Please let him know if this can be done. [phone]. [facility] [address]

## 2018-05-15 ENCOUNTER — Other Ambulatory Visit: Payer: Self-pay | Admitting: Family Medicine

## 2018-05-15 DIAGNOSIS — R202 Paresthesia of skin: Principal | ICD-10-CM

## 2018-05-15 DIAGNOSIS — R200 Anesthesia of skin: Secondary | ICD-10-CM

## 2018-08-14 ENCOUNTER — Other Ambulatory Visit: Payer: Self-pay

## 2018-08-14 DIAGNOSIS — I10 Essential (primary) hypertension: Secondary | ICD-10-CM

## 2018-08-15 MED ORDER — LISINOPRIL 5 MG PO TABS: ORAL_TABLET | ORAL | 0 refills | Status: DC

## 2018-08-15 NOTE — Telephone Encounter (Signed)
Please contact patient to schedule a office visit.  It has been a year since we have checked blood work for his blood pressure medicine.  Refilled x30 days with no additional refills until he follows up and is seen.

## 2018-08-16 NOTE — Telephone Encounter (Signed)
Contacted pt at home and wife answered and I tried to give her the information below and she started asking for an appointment for her child who was sick and in the conversation it was determined that the daughter was picked up by ambulance from her school and taken to hospital I believe that the pt (dad) was with that child.  The communication was not good so she put her daughter on the phone and I gave her the below information about pt scheduling an appointment  and also informed her that when the child that she was referencing gets out of hospital then they could call and schedule an appointment. Lamonte Sakai, April D, [place]

## 2018-09-16 ENCOUNTER — Other Ambulatory Visit: Payer: Self-pay | Admitting: Student in an Organized Health Care Education/Training Program

## 2018-09-16 DIAGNOSIS — R202 Paresthesia of skin: Secondary | ICD-10-CM

## 2018-09-16 DIAGNOSIS — I10 Essential (primary) hypertension: Secondary | ICD-10-CM

## 2018-09-16 DIAGNOSIS — R200 Anesthesia of skin: Secondary | ICD-10-CM

## 2018-09-18 NOTE — Telephone Encounter (Signed)
Please call and ask patient to schedule an OV so we can do monitoring blood work for his high blood pressure and talk about the amitriptyline.

## 2018-09-20 NOTE — Telephone Encounter (Signed)
Contacted pt and his wife answered and I told her to have him call to schedule appointment. And while on phone she wanted appointment for herself and this was scheduled. Gerald Parker, Keian Odriscoll D, [place]

## 2018-10-21 ENCOUNTER — Other Ambulatory Visit: Payer: Self-pay | Admitting: Student in an Organized Health Care Education/Training Program

## 2018-10-21 DIAGNOSIS — R200 Anesthesia of skin: Secondary | ICD-10-CM

## 2018-10-21 DIAGNOSIS — R202 Paresthesia of skin: Principal | ICD-10-CM

## 2018-10-24 NOTE — Telephone Encounter (Signed)
Please have patient schedule an appt

## 2018-11-04 NOTE — Telephone Encounter (Signed)
Called number listed. Received message that "Call can not be completed at this time, please call again later. If pt calls, please schedule an appt with Dr. Mosetta Putt. Sunday Spillers, CMA

## 2018-11-11 NOTE — Telephone Encounter (Signed)
Contacted pt and wife answered phone and an appointment was scheduled for pt.Gerald Parker, Markale Birdsell D, [place]

## 2018-11-20 ENCOUNTER — Other Ambulatory Visit: Payer: Self-pay | Admitting: Student in an Organized Health Care Education/Training Program

## 2018-11-20 ENCOUNTER — Ambulatory Visit: Payer: Medicaid Other | Admitting: Family Medicine

## 2018-11-20 DIAGNOSIS — R200 Anesthesia of skin: Secondary | ICD-10-CM

## 2018-11-20 DIAGNOSIS — R202 Paresthesia of skin: Principal | ICD-10-CM

## 2018-11-21 ENCOUNTER — Ambulatory Visit: Payer: Medicaid Other

## 2018-11-22 NOTE — Telephone Encounter (Signed)
Pt called and he is requesting for his precription on Amitriptyline refilled. Please call patient back at [phone]. ad

## 2018-11-26 NOTE — Telephone Encounter (Signed)
Pt has an appointment tomorrow. Will reach out to [facility] provider so amitriptyline can be discussed at that visit. There was some uncertainty from his previous PCP about why this medication was prescribed (prostate vs paresthesias which had resolved). Looks like he no showed his last two appointments. Holding off on refill for now.

## 2018-11-27 ENCOUNTER — Ambulatory Visit (INDEPENDENT_AMBULATORY_CARE_PROVIDER_SITE_OTHER): Payer: Self-pay | Admitting: Family Medicine

## 2018-11-27 ENCOUNTER — Other Ambulatory Visit: Payer: Self-pay

## 2018-11-27 VITALS — BP 122/64 | HR 80 | Temp 98.2°F | Ht 68.0 in | Wt 187.6 lb

## 2018-11-27 DIAGNOSIS — N401 Enlarged prostate with lower urinary tract symptoms: Secondary | ICD-10-CM

## 2018-11-27 DIAGNOSIS — R37 Sexual dysfunction, unspecified: Secondary | ICD-10-CM

## 2018-11-27 DIAGNOSIS — S29011A Strain of muscle and tendon of front wall of thorax, initial encounter: Secondary | ICD-10-CM

## 2018-11-27 DIAGNOSIS — N3943 Post-void dribbling: Secondary | ICD-10-CM

## 2018-11-27 MED ORDER — TAMSULOSIN HCL 0.4 MG PO CAPS: 0.4000 mg | ORAL_CAPSULE | Freq: Every day | ORAL | 3 refills | Status: DC

## 2018-11-27 MED ORDER — SILDENAFIL CITRATE 25 MG PO TABS: 25.0000 mg | ORAL_TABLET | Freq: Every day | ORAL | 0 refills | Status: DC | PRN

## 2018-11-27 NOTE — Assessment & Plan Note (Addendum)
Patient's post-void dribbling is likely a consequence of BPH, which could certainly be worsened by amitriptyline's anticholinergic side effects causing urinary retention.  However, patient says that amitriptyline is very effective for him and does not want to stop this medication.  Urology may have started this medication for chronic prostatitis, although I cannot see any notes in the chart from them to validate this.  Side effects of amitriptyline were reviewed in detail, including heart arrhythmia, anticholinergic side effects, and potential for death with overdose, and patient expressed understanding.  To the patient, the pros outweigh the cons of this medication, and I will relay this to his PCP.  We will add Flomax 0.4 mg to correct the post void dribbling.  Patient counseled that the dose of this medication can be increased in the future if needed.

## 2018-11-27 NOTE — Progress Notes (Signed)
Subjective:    Gerald Parker - 48 y.o. male MRN [id number]  Date of birth: 05/29/1971  CC:  Gerald Parker is here for discussion of amitiptyline for prostate pain, swollen area on sternum, and difficulty achieving erections.  HPI:  Prostate pain -Has had burning pain in his groin and during urination, and he was told by his urologist that this is due to prostate pain -He has been prescribed amitriptyline for the last 10 years for this issue, and he says that it helps significantly -His dose is even increased from 25 mg to 50 mg, and his urologist told him he can increase the dose further if needed -The amitriptyline also helps him sleep -Denies anticholinergic side effects -He has tried to go without amitriptyline, and his pain returns in this area -He has been to [facility], and the last time was about 8 months ago, although he cannot remember the physician's name -Has been on antibiotics in the distant past for prostatitis, and he thinks this is related to his chronic groin pain -Does continue to have dribbling urine, but does not have increased frequency of urination or straining during urination -Would like refills or 90 day quantity for his amitriptyline if possible  Swollen area on left sternum -Started about 2 weeks ago pain and swelling at the left sternoclavicular joint -Has had pain in this area as well as in his posterior left shoulder -Describes his job as highly physical, and 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 TETANUS/TDAP  10/16/1988  . INFLUENZA VACCINE  05/16/2018    -  reports that he has never smoked. He has never used smokeless tobacco. - Review of Systems: Per HPI. - Past Medical History: Patient Active Problem List   Diagnosis Date Noted  . Chest wall muscle strain 11/27/2018  . Hyperlipidemia 08/13/2017  . Sexual dysfunction 04/16/2016  . Scrotal pain 12/19/2013  . Numbness and tingling of foot 10/24/2013  . Hydrocele, right 06/28/2012  . INCOMPLETE VOIDING 01/30/2008  . DENTAL CARIES 10/31/2007  . HYPERTENSION, BENIGN 09/27/2007  . ALLERGIC RHINITIS 09/27/2007  . GERD 09/27/2007  . BPH (benign prostatic hyperplasia) 09/27/2007   - Medications: reviewed and updated   Objective:   Physical Exam BP 122/64   Pulse 80   Temp 98.2 F (36.8 C) (Oral)   Ht 5\' 8"  (1.727 m)   Wt 187 lb 9.6 oz (85.1 kg)   SpO2 97%   BMI 28.52 kg/m  Gen: NAD, alert, cooperative with exam, well-appearing CV: RRR, good S1/S2, no murmur, no edema Resp: CTABL, no wheezes, non-labored Musculoskeletal: Mild swelling noted in the left sternoclavicular joint, mildly tender to palpation over this joint, full range of motion of bilateral upper extremities Psych: good insight, alert and oriented     Assessment & Plan:   BPH (benign prostatic hyperplasia) Patient's post-void dribbling is likely a consequence of BPH, which could certainly be worsened by amitriptyline's anticholinergic side effects causing urinary retention.  However, patient says that amitriptyline  is very effective for him and does not want to stop this medication.  Urology may have started this medication for chronic prostatitis, although I cannot see any notes in the chart from them to validate this.  Side effects of amitriptyline were reviewed in detail, including heart arrhythmia, anticholinergic side effects, and potential for death with overdose, and patient expressed understanding.  To the patient, the pros  outweigh the cons of this medication, and I will relay this to his PCP.  We will add Flomax 0.4 mg to correct the post void dribbling.  Patient counseled that the dose of this medication can be increased in the future if needed.  Chest wall muscle strain Swollen area of left sternum is consistent with muscle strain, especially since he is using his upper body frequently at work and he has had pain around that shoulder as well that has improved in the last couple weeks.  Reassured patient and said that Tylenol and ibuprofen can improve his pain.  Sexual dysfunction Will try sildenafil 25 mg for erectile dysfunction.  He does not have any contraindications to this medicine.  Patient warned never to take this with nitroglycerin due to potential to severely lower his blood pressure, and he expressed understanding.    Lezlie OctaveAmanda Seymone Forlenza, M.D. 11/27/2018, 10:36 AM PGY-2, [facility]

## 2018-11-27 NOTE — Assessment & Plan Note (Signed)
Will try sildenafil 25 mg for erectile dysfunction.  He does not have any contraindications to this medicine.  Patient warned never to take this with nitroglycerin due to potential to severely lower his blood pressure, and he expressed understanding.

## 2018-11-27 NOTE — Assessment & Plan Note (Signed)
Swollen area of left sternum is consistent with muscle strain, especially since he is using his upper body frequently at work and he has had pain around that shoulder as well that has improved in the last couple weeks.  Reassured patient and said that Tylenol and ibuprofen can improve his pain.

## 2018-11-28 ENCOUNTER — Ambulatory Visit: Payer: Medicaid Other | Admitting: Student in an Organized Health Care Education/Training Program

## 2018-12-03 ENCOUNTER — Telehealth: Payer: Self-pay | Admitting: Student in an Organized Health Care Education/Training Program

## 2018-12-03 NOTE — Telephone Encounter (Signed)
Spoke with pharmacy.  The issue is the price of medication.  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 The tamsulosin will cost $64 and the viagra will cost $400.  Explained the issue to patient.  He will see what he can do.  States that he will likely be able to pick up tamsulosin but will check on coupons for viagra. Anahy Esh, Maryjo Rochester, CMA

## 2018-12-03 NOTE — Telephone Encounter (Signed)
Pt is calling to let Dr. Frances Furbish know that [facility] said they have not received his prescriptions for sidenafil or tamsulosin.   It shows that the prescriptions were sent on 02/12.  [facility] would [facility]

## 2018-12-09 ENCOUNTER — Ambulatory Visit: Payer: Medicaid Other | Admitting: Student in an Organized Health Care Education/Training Program

## 2018-12-18 ENCOUNTER — Other Ambulatory Visit: Payer: Self-pay | Admitting: Student in an Organized Health Care Education/Training Program

## 2018-12-18 DIAGNOSIS — I10 Essential (primary) hypertension: Secondary | ICD-10-CM

## 2018-12-18 DIAGNOSIS — R202 Paresthesia of skin: Principal | ICD-10-CM

## 2018-12-18 DIAGNOSIS — R200 Anesthesia of skin: Secondary | ICD-10-CM

## 2019-01-17 ENCOUNTER — Other Ambulatory Visit: Payer: Self-pay | Admitting: Student in an Organized Health Care Education/Training Program

## 2019-01-17 DIAGNOSIS — R200 Anesthesia of skin: Secondary | ICD-10-CM

## 2019-01-17 DIAGNOSIS — I10 Essential (primary) hypertension: Secondary | ICD-10-CM

## 2019-01-17 DIAGNOSIS — R202 Paresthesia of skin: Principal | ICD-10-CM

## 2019-02-12 ENCOUNTER — Ambulatory Visit: Payer: Medicaid Other

## 2019-02-14 ENCOUNTER — Encounter: Payer: Self-pay | Admitting: Family Medicine

## 2019-02-14 ENCOUNTER — Ambulatory Visit (INDEPENDENT_AMBULATORY_CARE_PROVIDER_SITE_OTHER): Payer: Medicaid Other | Admitting: Family Medicine

## 2019-02-14 ENCOUNTER — Ambulatory Visit
Admission: RE | Admit: 2019-02-14 | Discharge: 2019-02-14 | Disposition: A | Discharge status: Home / Self Care | Payer: Medicaid Other | Source: Ambulatory Visit | Attending: Family Medicine | Admitting: Family Medicine

## 2019-02-14 ENCOUNTER — Other Ambulatory Visit: Payer: Self-pay

## 2019-02-14 VITALS — BP 125/90 | HR 90

## 2019-02-14 DIAGNOSIS — M940 Chondrocostal junction syndrome [Tietze]: Secondary | ICD-10-CM

## 2019-02-14 DIAGNOSIS — M25512 Pain in left shoulder: Secondary | ICD-10-CM | POA: Diagnosis not present

## 2019-02-14 MED ORDER — DICLOFENAC SODIUM 1 % TD GEL: 4.0000 g | Freq: Four times a day (QID) | TRANSDERMAL | 11 refills | Status: DC

## 2019-02-14 MED ORDER — MELOXICAM 7.5 MG PO TABS: 7.5000 mg | ORAL_TABLET | Freq: Every day | ORAL | 0 refills | Status: DC

## 2019-02-14 NOTE — Patient Instructions (Signed)
  It was wonderful to see you today.  Thank you for choosing [facility].   Please call [phone] with any questions about today's appointment.  Please be sure to schedule follow up at the front  desk before you leave today.   Terisa Starr, MD  Family Medicine    Go to get your x-ray today   I will call you with results

## 2019-02-14 NOTE — Progress Notes (Signed)
  Patient Name: Gerald Parker Date of Birth: [birth year] Date of Visit: 02/14/19 PCP: Howard Pouch, MD  Chief Complaint:  Left clavicle pain   Subjective: Gerald Parker is a pleasant 48 y.o. with medical history significant for HTN and dyslipidemia presenting today for left clavicle pain. The patient reports this has been an on and off problem for 1-2 years. 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 He reports 2-3 weeks of left clavicle pain. This located at sternoclavicular junction on the left side. No redness, trauma, injury, fevers, cough, chest pain, or history of gout. No history of substance use. No prior trauma. No recent changes in activity.  The patient takes Lisinopril daily. He declines labs today.     ROS: as above ROS  I have reviewed the patient's medical, surgical, family, and social history as appropriate.  HEENT: Sclera anicteric. Dentition is moderate. Appears well hydrated. Neck: Supple Cardiac: Regular rate and rhythm. Normal S1/S2. No murmurs, rubs, or gallops appreciated. Lungs: Clear bilaterally to ascultation.  MSK: Asymmetry of L clavicular head as compared to right with mild edema, no redness or warmth, ultrasound shows a small effusion with some internal hyperechoic echoes in the joint space  Extremities: Warm, well perfused without edema.  Skin: Warm, dry Psych: Pleasant and appropriate  NO LAD  Vitals:   02/14/19 1007  BP: 125/90  Pulse: 90  SpO2: 100%   There were no vitals filed for this visit.   Gerald Parker was seen today for clavical pain.  Diagnoses and all orders for this visit:  Left sternoclavicular joint injury with effusion of the left sternoclavicular joint, possibly due to disruption of the anterior ligament due to overuse or prior injury. Will treat as below, in not improved, refer to Sports Med for consideration of injection -     DG Clavicle Left; Future -     meloxicam (MOBIC) 7.5 MG tablet; Take 1 tablet  (7.5 mg total) by mouth daily. -     diclofenac sodium (VOLTAREN) 1 % GEL; Apply 4 g topically 4 (four) times daily. - Discussed stretching exercise, demonstrated, handout given.   HTN recommended BMP today, patient declined.  Dyslipidemia, recommended lipid panel, patient will obtain at follow up.    Terisa Starr, MD  Family Medicine Teaching Service

## 2019-02-17 DIAGNOSIS — M9901 Segmental and somatic dysfunction of cervical region: Secondary | ICD-10-CM | POA: Diagnosis not present

## 2019-02-17 DIAGNOSIS — M5414 Radiculopathy, thoracic region: Secondary | ICD-10-CM | POA: Diagnosis not present

## 2019-02-17 DIAGNOSIS — M531 Cervicobrachial syndrome: Secondary | ICD-10-CM | POA: Diagnosis not present

## 2019-02-17 DIAGNOSIS — M9902 Segmental and somatic dysfunction of thoracic region: Secondary | ICD-10-CM | POA: Diagnosis not present

## 2019-02-18 DIAGNOSIS — M9901 Segmental and somatic dysfunction of cervical region: Secondary | ICD-10-CM | POA: Diagnosis not present

## 2019-02-18 DIAGNOSIS — M531 Cervicobrachial syndrome: Secondary | ICD-10-CM | POA: Diagnosis not present

## 2019-02-18 DIAGNOSIS — M5414 Radiculopathy, thoracic region: Secondary | ICD-10-CM | POA: Diagnosis not present

## 2019-02-18 DIAGNOSIS — M9902 Segmental and somatic dysfunction of thoracic region: Secondary | ICD-10-CM | POA: Diagnosis not present

## 2019-02-20 DIAGNOSIS — M9901 Segmental and somatic dysfunction of cervical region: Secondary | ICD-10-CM | POA: Diagnosis not present

## 2019-02-20 DIAGNOSIS — M9902 Segmental and somatic dysfunction of thoracic region: Secondary | ICD-10-CM | POA: Diagnosis not present

## 2019-02-20 DIAGNOSIS — M5414 Radiculopathy, thoracic region: Secondary | ICD-10-CM | POA: Diagnosis not present

## 2019-02-20 DIAGNOSIS — M531 Cervicobrachial syndrome: Secondary | ICD-10-CM | POA: Diagnosis not present

## 2019-02-24 DIAGNOSIS — M9901 Segmental and somatic dysfunction of cervical region: Secondary | ICD-10-CM | POA: Diagnosis not present

## 2019-02-24 DIAGNOSIS — M531 Cervicobrachial syndrome: Secondary | ICD-10-CM | POA: Diagnosis not present

## 2019-02-24 DIAGNOSIS — M9902 Segmental and somatic dysfunction of thoracic region: Secondary | ICD-10-CM | POA: Diagnosis not present

## 2019-02-24 DIAGNOSIS — M5414 Radiculopathy, thoracic region: Secondary | ICD-10-CM | POA: Diagnosis not present

## 2019-02-26 DIAGNOSIS — M9902 Segmental and somatic dysfunction of thoracic region: Secondary | ICD-10-CM | POA: Diagnosis not present

## 2019-02-26 DIAGNOSIS — M5414 Radiculopathy, thoracic region: Secondary | ICD-10-CM | POA: Diagnosis not present

## 2019-02-26 DIAGNOSIS — M531 Cervicobrachial syndrome: Secondary | ICD-10-CM | POA: Diagnosis not present

## 2019-02-26 DIAGNOSIS — M9901 Segmental and somatic dysfunction of cervical region: Secondary | ICD-10-CM | POA: Diagnosis not present

## 2019-02-28 DIAGNOSIS — M9901 Segmental and somatic dysfunction of cervical region: Secondary | ICD-10-CM | POA: Diagnosis not present

## 2019-02-28 DIAGNOSIS — M9902 Segmental and somatic dysfunction of thoracic region: Secondary | ICD-10-CM | POA: Diagnosis not present

## 2019-02-28 DIAGNOSIS — M531 Cervicobrachial syndrome: Secondary | ICD-10-CM | POA: Diagnosis not present

## 2019-02-28 DIAGNOSIS — M5414 Radiculopathy, thoracic region: Secondary | ICD-10-CM | POA: Diagnosis not present

## 2019-03-26 DIAGNOSIS — M9902 Segmental and somatic dysfunction of thoracic region: Secondary | ICD-10-CM | POA: Diagnosis not present

## 2019-03-26 DIAGNOSIS — M5414 Radiculopathy, thoracic region: Secondary | ICD-10-CM | POA: Diagnosis not present

## 2019-03-26 DIAGNOSIS — M531 Cervicobrachial syndrome: Secondary | ICD-10-CM | POA: Diagnosis not present

## 2019-03-26 DIAGNOSIS — M9901 Segmental and somatic dysfunction of cervical region: Secondary | ICD-10-CM | POA: Diagnosis not present

## 2019-04-07 DIAGNOSIS — M9902 Segmental and somatic dysfunction of thoracic region: Secondary | ICD-10-CM | POA: Diagnosis not present

## 2019-04-07 DIAGNOSIS — M9901 Segmental and somatic dysfunction of cervical region: Secondary | ICD-10-CM | POA: Diagnosis not present

## 2019-04-07 DIAGNOSIS — M531 Cervicobrachial syndrome: Secondary | ICD-10-CM | POA: Diagnosis not present

## 2019-04-07 DIAGNOSIS — M5414 Radiculopathy, thoracic region: Secondary | ICD-10-CM | POA: Diagnosis not present

## 2019-04-09 ENCOUNTER — Ambulatory Visit (INDEPENDENT_AMBULATORY_CARE_PROVIDER_SITE_OTHER): Payer: Medicaid Other | Admitting: Family Medicine

## 2019-04-09 ENCOUNTER — Other Ambulatory Visit: Payer: Self-pay

## 2019-04-09 VITALS — BP 136/92 | HR 101

## 2019-04-09 DIAGNOSIS — M19012 Primary osteoarthritis, left shoulder: Secondary | ICD-10-CM | POA: Diagnosis not present

## 2019-04-09 MED ORDER — DICLOFENAC SODIUM 1 % TD GEL: 4.0000 g | Freq: Four times a day (QID) | TRANSDERMAL | 0 refills | Status: DC

## 2019-04-09 NOTE — Assessment & Plan Note (Signed)
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  No clear findings on plain film.  Low concern for septic arthritis.  Could be autoimmune, however this seems less likely.  No signs or symptoms concerning for ACS.  Does not seem to affect rotator cuff. - Called a new prescription for Voltaren 1% gel with instructions to apply 4 times daily for the next 2 weeks - RTC 2 weeks if not improved

## 2019-04-09 NOTE — Patient Instructions (Signed)
Thank you for coming in to see [place] today. Please see below to review our plan for today's visit.  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  Take the Voltaren gel and apply this to the affected area 4 times daily.  Keep your shoulder about 2 weeks to improve.  If you continue having pain, please follow-up with [place].  Please call the clinic at [phone] if your symptoms worsen or you have any concerns. It was our pleasure to serve you.  Harriet [place], [place], PGY-3

## 2019-04-09 NOTE — Progress Notes (Signed)
   Subjective   Patient ID: Gerald Parker    DOB: 01/09/1971, 48 y.o. male   MRN: [id number]  CC: "Left clavicle pain"  HPI: Gerald Parker is a 48 y.o. male who presents to clinic today for the following:  Left clavicular pain: Gerald Parker is presented today for follow-up for intermittent pain localized to the left sternoclavicular joint.  Pain is elicited with movement and worse when patient lies on left shoulder.  He denies any motor weakness or sensory loss, or pain localized to the shoulder.  He did try the meloxicam for 6 days with minimal improvement but did not pick up the Voltaren gel.  Patient denies trauma or history of injury.  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  Pain has been unchanged over the last month and he was seen approximately 2 months ago with a normal plain film of the left sternoclavicular joint.  He denies other affiliated joints, fevers or chills, shortness of breath or chest pain.  ROS: see HPI for pertinent.  [place]: Reviewed. Smoking status reviewed. Medications reviewed.  Objective   BP (!) 136/92   Pulse (!) 101   SpO2 97%  Vitals and nursing note reviewed.  General: well nourished, well developed, NAD with non-toxic appearance HEENT: normocephalic, atraumatic, moist mucous membranes Neck: supple, non-tender, no supraclavicular lymphadenopathy Lungs: normal work of breathing Skin: warm, dry, no rashes or lesions MSK: firm and mildly tender palpable mass over left sternoclavicular joint compared to right which is non-mobile with intact ROM of left shoulder, 5/5 motor strength in upper extremities  Assessment & Plan   Arthritis of left sternoclavicular joint Likely arthritic.  No clear findings on plain film.  Low concern for septic arthritis.  Could be autoimmune, however this seems less likely.  No signs or symptoms concerning for ACS.  Does not seem to affect  rotator cuff. - Called a new prescription for Voltaren 1% gel with instructions to apply 4 times daily for the next 2 weeks - RTC 2 weeks if not improved  No orders of the defined types were placed in this encounter.  Meds ordered this encounter  Medications  . diclofenac sodium (VOLTAREN) 1 % GEL    Sig: Apply 4 g topically 4 (four) times daily.    Dispense:  50 g    Refill:  0    Gerald Parker, [place], PGY-3 04/09/2019, 2:55 PM

## 2019-04-27 ENCOUNTER — Other Ambulatory Visit: Payer: Self-pay | Admitting: Student in an Organized Health Care Education/Training Program

## 2019-04-27 DIAGNOSIS — I10 Essential (primary) hypertension: Secondary | ICD-10-CM

## 2019-04-27 DIAGNOSIS — R200 Anesthesia of skin: Secondary | ICD-10-CM

## 2019-05-09 DIAGNOSIS — S43205A Unspecified dislocation of left sternoclavicular joint, initial encounter: Secondary | ICD-10-CM | POA: Diagnosis not present

## 2019-05-09 DIAGNOSIS — M25512 Pain in left shoulder: Secondary | ICD-10-CM | POA: Diagnosis not present

## 2019-05-14 ENCOUNTER — Other Ambulatory Visit: Payer: Self-pay | Admitting: Orthopaedic Surgery

## 2019-05-14 DIAGNOSIS — R52 Pain, unspecified: Secondary | ICD-10-CM

## 2019-07-26 ENCOUNTER — Other Ambulatory Visit: Payer: Self-pay | Admitting: Family Medicine

## 2019-07-26 DIAGNOSIS — R200 Anesthesia of skin: Secondary | ICD-10-CM

## 2019-07-26 DIAGNOSIS — R202 Paresthesia of skin: Secondary | ICD-10-CM

## 2019-08-07 ENCOUNTER — Other Ambulatory Visit: Payer: Self-pay | Admitting: Family Medicine

## 2019-08-07 DIAGNOSIS — I10 Essential (primary) hypertension: Secondary | ICD-10-CM

## 2019-08-08 NOTE — Telephone Encounter (Signed)
Needs appointment. Please call and let patient know to schedule an appointment prior to his medication running out.

## 2019-10-30 ENCOUNTER — Other Ambulatory Visit: Payer: Self-pay | Admitting: Family Medicine

## 2019-10-30 DIAGNOSIS — R200 Anesthesia of skin: Secondary | ICD-10-CM

## 2019-10-30 DIAGNOSIS — I10 Essential (primary) hypertension: Secondary | ICD-10-CM

## 2019-10-30 DIAGNOSIS — R202 Paresthesia of skin: Secondary | ICD-10-CM

## 2019-11-03 NOTE — Telephone Encounter (Signed)
Please call patient and schedule chronic disease follow up appointment for hypertension. They have requested refill but have not been seen recently for chronic problem follow up. Please let them know that I will not be filling this medication without follow up.

## 2019-11-14 ENCOUNTER — Other Ambulatory Visit: Payer: Self-pay

## 2019-11-14 ENCOUNTER — Ambulatory Visit (INDEPENDENT_AMBULATORY_CARE_PROVIDER_SITE_OTHER): Payer: Medicaid Other | Admitting: Family Medicine

## 2019-11-14 ENCOUNTER — Encounter: Payer: Self-pay | Admitting: Family Medicine

## 2019-11-14 VITALS — BP 122/92 | HR 84 | Wt 192.6 lb

## 2019-11-14 DIAGNOSIS — I10 Essential (primary) hypertension: Secondary | ICD-10-CM

## 2019-11-14 DIAGNOSIS — N3943 Post-void dribbling: Secondary | ICD-10-CM | POA: Diagnosis not present

## 2019-11-14 DIAGNOSIS — Z1211 Encounter for screening for malignant neoplasm of colon: Secondary | ICD-10-CM | POA: Diagnosis not present

## 2019-11-14 DIAGNOSIS — G479 Sleep disorder, unspecified: Secondary | ICD-10-CM | POA: Diagnosis not present

## 2019-11-14 DIAGNOSIS — N398 Other specified disorders of urinary system: Secondary | ICD-10-CM | POA: Diagnosis not present

## 2019-11-14 DIAGNOSIS — E782 Mixed hyperlipidemia: Secondary | ICD-10-CM | POA: Diagnosis not present

## 2019-11-14 DIAGNOSIS — N401 Enlarged prostate with lower urinary tract symptoms: Secondary | ICD-10-CM

## 2019-11-14 MED ORDER — LISINOPRIL 5 MG PO TABS: ORAL_TABLET | ORAL | 2 refills | Status: DC

## 2019-11-14 MED ORDER — AMITRIPTYLINE HCL 50 MG PO TABS: 50.0000 mg | ORAL_TABLET | Freq: Every evening | ORAL | 2 refills | Status: DC | PRN

## 2019-11-14 NOTE — Assessment & Plan Note (Signed)
Patient not currently taking Flomax.  No urinary issues at this time.  Instructed to follow-up if he experiences any issues.

## 2019-11-14 NOTE — Assessment & Plan Note (Signed)
Patient reports that he is taking amitriptyline to help with bladder relaxation.  He has been on this medication for many years.  He is not currently taking his Flomax.  If patient develops any urinary retention or difficulties with urination, he has been instructed to come back to the office for further evaluation.

## 2019-11-14 NOTE — Addendum Note (Signed)
Addended by: Melene Plan on: 11/14/2019 05:46 PM   Modules accepted: Orders

## 2019-11-14 NOTE — Patient Instructions (Signed)
Dear Gerald Parker,   It was good to see you! Thank you for taking your time to come in to be seen. Today, we discussed the following:   Medication Refill   Your blood pressure looks great today.   No changes in any medication   We updated your medication list here   Please follow up in one year for next check up or sooner if needed   Someone will call you to schedule a screening coloscopy   Be well,   Genia Hotter, M.D   [facility] [phone]  *Sign up for MyChart for instant access to your health profile, labs, orders, upcoming appointments or to contact your provider with questions*  ===================================================================================

## 2019-11-14 NOTE — Assessment & Plan Note (Addendum)
Patient's lipid panel noted to be elevated in the past.  Patient does not have time to perform labs today but will make a lab visit at his earliest convenience.  Will likely need to be started on statin

## 2019-11-14 NOTE — Assessment & Plan Note (Signed)
Patient has been taking amitriptyline for nearly a decade.  He reports that he currently takes it for insomnia and to help relax his bladder.  See incomplete voiding.

## 2019-11-14 NOTE — Assessment & Plan Note (Signed)
Blood pressure overall well controlled.  Currently taking lisinopril 5 mg.  Review of systems is negative.  No changes to medication today.  Will check BMP for kidney function.

## 2019-11-14 NOTE — Progress Notes (Signed)
  Subjective  Gerald Parker is a 49 y.o. male who presents to clinic today with the following problems:  HTN follow up  Takes BP medication at home. 120-130/80-90. Currently taking lisinopril 5 mg. He denies chest pressure/discomfort, fatigue, near-syncope, palpitations and syncope. CV risk factors include BMI and hyperlipidemia, not currently on statin.   Sleeping difficulty  Bladder dysfunction   Hx of BPH  Reports taking amitriptyline to help with sleep and bladder relaxation. Has been taking since 2011. Patient reports that he is not taking his flomax. Denies any current issues with bladder dysfunction. Requests refill for amitriptyline.   Objective  Physical Exam BP (!) 122/92   Pulse 84   Wt 192 lb 9.6 oz (87.4 kg)   SpO2 100%   BMI 29.28 kg/m  General: Well appearing male, NAD.   Assessment & Plan    Problem List Items Addressed This Visit      Active Problems   Essential hypertension - Primary    Blood pressure overall well controlled.  Currently taking lisinopril 5 mg.  Review of systems is negative.  No changes to medication today.  Will check BMP for kidney function.      Relevant Medications   lisinopril (ZESTRIL) 5 MG tablet (Start on 12/15/2019)   Voiding dysfunction    Patient reports that he is taking amitriptyline to help with bladder relaxation.  He has been on this medication for many years.  He is not currently taking his Flomax.  If patient develops any urinary retention or difficulties with urination, he has been instructed to come back to the office for further evaluation.       BPH (benign prostatic hyperplasia)    Patient not currently taking Flomax.  No urinary issues at this time.  Instructed to follow-up if he experiences any issues.      Hyperlipidemia    Patient's lipid panel noted to be elevated in the past.  Patient does not have time to perform labs today but will make a lab visit at his earliest convenience.  Will likely need to be started on  statin      Relevant Medications   lisinopril (ZESTRIL) 5 MG tablet (Start on 12/15/2019)   Other Relevant Orders   Lipid Panel   Colon cancer screening   Relevant Orders   [facility] COLONOSCOPY (Completed)   Ambulatory referral to Gastroenterology   Difficulty sleeping    Patient has been taking amitriptyline for nearly a decade.  He reports that he currently takes it for insomnia and to help relax his bladder.  See incomplete voiding.       Relevant Medications   amitriptyline (ELAVIL) 50 MG tablet (Start on 12/15/2019)    Patient declines Tdap today. Melene Plan, M.D.  5:43 PM 11/14/2019

## 2020-01-19 ENCOUNTER — Encounter: Payer: Self-pay | Admitting: Family Medicine

## 2020-09-06 ENCOUNTER — Other Ambulatory Visit: Payer: Self-pay

## 2020-09-06 ENCOUNTER — Ambulatory Visit (INDEPENDENT_AMBULATORY_CARE_PROVIDER_SITE_OTHER): Payer: Medicaid Other

## 2020-09-06 ENCOUNTER — Ambulatory Visit (HOSPITAL_COMMUNITY)
Admission: EM | Admit: 2020-09-06 | Discharge: 2020-09-06 | Disposition: A | Discharge status: Home / Self Care | Payer: Medicaid Other | Attending: Family Medicine | Admitting: Family Medicine

## 2020-09-06 ENCOUNTER — Encounter (HOSPITAL_COMMUNITY): Payer: Self-pay

## 2020-09-06 DIAGNOSIS — R0789 Other chest pain: Secondary | ICD-10-CM

## 2020-09-06 DIAGNOSIS — R079 Chest pain, unspecified: Secondary | ICD-10-CM

## 2020-09-06 MED ORDER — KETOROLAC TROMETHAMINE 60 MG/2ML IM SOLN
60.0000 mg | Freq: Once | INTRAMUSCULAR | Status: AC
  Administered 2020-09-06: 60 mg via INTRAMUSCULAR

## 2020-09-06 MED ORDER — TIZANIDINE HCL 4 MG PO TABS: 4.0000 mg | ORAL_TABLET | Freq: Three times a day (TID) | ORAL | 0 refills | Status: DC | PRN

## 2020-09-06 MED ORDER — KETOROLAC TROMETHAMINE 60 MG/2ML IM SOLN
INTRAMUSCULAR | Status: AC
  Filled 2020-09-06: qty 2

## 2020-09-06 MED ORDER — NAPROXEN 500 MG PO TABS: 500.0000 mg | ORAL_TABLET | Freq: Two times a day (BID) | ORAL | 0 refills | Status: DC

## 2020-09-06 NOTE — ED Triage Notes (Signed)
Pt presents with sharp right side chest pain since yesterday.

## 2020-09-06 NOTE — ED Provider Notes (Addendum)
Gerald [facility]   MRN: [id number] DOB: 1971-03-04  Subjective:   Gerald Parker is a 49 y.o. male presenting for 1 day history of acute onset moderate to severe persistent and worsening right-sided chest pain that extends to his lateral chest wall.  Patient states that standing relieves the pain, has been using ibuprofen with some relief as well.  Denies any particular fall, trauma.  Denies fever, cough, shortness of breath.  However, the pain can become severe to the point that he has a difficult time breathing.  Denies history of heart disease, MI.  Has a history of high blood pressure and takes medication for this.  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  However, he was moving over the past week to a new home.  Denies any particular inciting events then.  Patient is not a smoker, denies history of respiratory disorders.  No current facility-administered medications for this encounter.  Current Outpatient Medications:    amitriptyline (ELAVIL) 50 MG tablet, Take 1 tablet (50 mg total) by mouth at bedtime as needed. for sleep, Disp: 90 tablet, Rfl: 2   lisinopril (ZESTRIL) 5 MG tablet, TAKE 1 TABLET BY MOUTH ONCE DAILY (NEEDS  APPOINTMENT), Disp: 90 tablet, Rfl: 2   lisinopril (ZESTRIL) 5 MG tablet, TAKE 1 TABLET BY MOUTH ONCE DAILY. SCHEDULE OFFICE VISIT., Disp: , Rfl:    No Known Allergies  Past Medical History:  Diagnosis Date   ALLERGIC RHINITIS 09/27/2007   Qualifier: Diagnosis of  By: Daphine Deutscher FNP, [facility]     DENTAL CARIES 10/31/2007   Qualifier: Diagnosis of  By: Daphine Deutscher FNP, [facility]     GERD 09/27/2007   Qualifier: Diagnosis of  By: Daphine Deutscher FNP, [facility]     GERD (gastroesophageal reflux disease)    Hydrocele, right 06/28/2012   Hypertension    Numbness and tingling of foot 10/24/2013   Patient has a chronic history of this problem. Has been screened for diabetes in past. Symptoms seem to be related to patient's work.     Scrotal pain 12/19/2013   Patient has an identical presentation two years ago with a history of hydrocele. Previously was followed by [facility]. Differential includes hydrocele vs spermatocele vs epididymitis vs orchitis. Most likely hydrocele from physical exam and history, although could not illicit transillumination of the scrotum. Wanted GC/chlamydia but it was not obtained. Will have patient get ultrasound and    Sexual dysfunction 04/16/2016     History reviewed. No pertinent surgical history.  Family History  Problem Relation Age of Onset   Hypertension Mother    Diabetes type II Mother    Diabetes type II Father     Social History   Tobacco Use   Smoking status: Never Smoker   Smokeless tobacco: Never Used  Substance Use Topics   Alcohol use: No   Drug use: No    ROS   Objective:   Vitals: BP (!) 151/92 (BP Location: Left Arm)    Pulse 87    Temp 98.3 F (36.8 C) (Oral)    Resp 17    SpO2 99%   Physical Exam Constitutional:      General: He is not in acute distress.    Appearance: Normal appearance. He is well-developed. He is not ill-appearing, toxic-appearing or diaphoretic.  HENT:     Head: Normocephalic and atraumatic.     Right Ear: External ear normal.     Left Ear: External  ear normal.     Nose: Nose normal.     Mouth/Throat:     Mouth: Mucous membranes are moist.     Pharynx: Oropharynx is clear.  Eyes:     General: No scleral icterus.    Extraocular Movements: Extraocular movements intact.     Pupils: Pupils are equal, round, and reactive to light.  Cardiovascular:     Rate and Rhythm: Normal rate and regular rhythm.     Heart sounds: Normal heart sounds. No murmur heard.  No friction rub. No gallop.   Pulmonary:     Effort: Pulmonary effort is normal. No respiratory distress.     Breath sounds: Normal breath sounds. No stridor. No wheezing, rhonchi or rales.  Chest:     Chest wall: Tenderness (area of reproducible chest wall  tenderness as outlined) present. No mass, lacerations, deformity, swelling, crepitus or edema.    Neurological:     Mental Status: He is alert and oriented to person, place, and time.  Psychiatric:        Mood and Affect: Mood normal.        Behavior: Behavior normal.        Thought Content: Thought content normal.     ED ECG REPORT   Date: 09/06/2020  Rate: 80bpm  Rhythm: normal sinus rhythm  QRS Axis: normal  Intervals: normal  ST/T Wave abnormalities: nonspecific T wave changes  Conduction Disutrbances:none  Narrative Interpretation: Sinus rhythm at 80 bpm with nonspecific T wave inversion in lead III.  Very comparable to previous EKG.  Old EKG Reviewed: unchanged  I have personally reviewed the EKG tracing and agree with the computerized printout as noted.  DG Chest 2 View  Result Date: 09/06/2020 CLINICAL DATA:  Chest pain. EXAM: CHEST - 2 VIEW COMPARISON:  Feb 26, 2016. FINDINGS: The heart size and mediastinal contours are within normal limits. Both lungs are clear. No pneumothorax or pleural effusion is noted. The visualized skeletal structures are unremarkable. IMPRESSION: No active cardiopulmonary disease. Electronically Signed   By: Lupita Raider M.D.   On: 09/06/2020 09:27   IM Toradol in clinic.  Assessment and Plan :   PDMP not reviewed this encounter.  1. Chest wall pain     Patient has reassuring EKG, chest x-ray.  Physical exam findings with reproducible chest wall tenderness.  Recommended managing this with NSAID, muscle relaxant and rest.  Suspect all this arose from all the physical demands of moving her in the past week. Counseled patient on potential for adverse effects with medications prescribed/recommended today, ER and return-to-clinic precautions discussed, patient verbalized understanding.     Wallis Bamberg, [place] 09/06/20 [phone]

## 2020-11-04 ENCOUNTER — Encounter: Payer: Self-pay | Admitting: Family Medicine

## 2020-11-04 ENCOUNTER — Other Ambulatory Visit: Payer: Self-pay

## 2020-11-04 ENCOUNTER — Ambulatory Visit: Payer: Medicaid Other | Admitting: Family Medicine

## 2020-11-04 VITALS — BP 120/82 | HR 96 | Wt 196.0 lb

## 2020-11-04 DIAGNOSIS — M79601 Pain in right arm: Secondary | ICD-10-CM | POA: Diagnosis not present

## 2020-11-04 DIAGNOSIS — I10 Essential (primary) hypertension: Secondary | ICD-10-CM

## 2020-11-04 DIAGNOSIS — G479 Sleep disorder, unspecified: Secondary | ICD-10-CM

## 2020-11-04 DIAGNOSIS — N398 Other specified disorders of urinary system: Secondary | ICD-10-CM | POA: Diagnosis not present

## 2020-11-04 NOTE — Patient Instructions (Signed)
It was nice to meet you today,   I am going to get an x ray of your shoulder and neck.  I will call you with the results.  I will then put in a referral to sports medicine for you.    Please do not take more ibuprofen than what is directed on the bottle.    Have a great day,   Frederic Jericho, MD

## 2020-11-04 NOTE — Progress Notes (Signed)
    SUBJECTIVE:   CHIEF COMPLAINT / HPI:   Htn: Patient takes his lisinopril nightly. No side effects. Lisinopril. Takes at night.   Shoulder pain: Pain started 6 weeks ago on December 20 when he woke up with pain in his right shoulder.  2 weeks ago the pain began traveling down to his arm.  He has a "electric shock" pain occasionally in the right arm that can be triggered by turning his head to the right.  He has been to a chiropractor but it was getting too expensive so he stopped going.  He has numbness in the index finger on his right side.  No weakness in the right hand.  At work he drives a truck.  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  Patient given amitriptyline at night to help with sleep and back pain.  He is compliant with the medication takes it nightly.  Needs a refill of this medication  Hld: Patient willing to get lipid panel today.  PERTINENT  PMH / PSH: htn  OBJECTIVE:   BP 120/82   Pulse 96   Wt 196 lb (88.9 kg)   SpO2 97%   BMI 29.80 kg/m   Gen: alert, oriented. No acute distres.  Cv: rrr. No murmurs Pulm: lctab.  No wheezes or crackles.   Msk: normal ROM in upper extremities b/l.  5/5 strength in shoulders and grip bilaterally.  Normal empty can and hawkins test.   ASSESSMENT/PLAN:   Pain of right upper extremity Describes initial right shoulder pain that appears to have progressed to include the rest of the arm with radiculopathy symptoms of paresthesia and anesthesia. No reported weakness and no weakness on exam.  Will get x ray of cervical spine and right shoulder for further evaluation.  Consider referral to sports medicine based on findings.   Essential hypertension BP at goal today. Continue current medications.   Voiding dysfunction Patient unable to fully articulate what he is taking this medication for due to language issue but previous notes from pcp indicate he is taking it for bladder dysfunction.  Pt needs refill.  Will send in refill  today.      Sandre Kitty, MD [facility]

## 2020-11-06 DIAGNOSIS — M79601 Pain in right arm: Secondary | ICD-10-CM | POA: Insufficient documentation

## 2020-11-06 MED ORDER — AMITRIPTYLINE HCL 50 MG PO TABS: 50.0000 mg | ORAL_TABLET | Freq: Every evening | ORAL | 2 refills | Status: DC | PRN

## 2020-11-06 NOTE — Assessment & Plan Note (Signed)
Describes initial right shoulder pain that appears to have progressed to include the rest of the arm with radiculopathy symptoms of paresthesia and anesthesia. No reported weakness and no weakness on exam.  Will get x ray of cervical spine and right shoulder for further evaluation.  Consider referral to sports medicine based on findings.

## 2020-11-06 NOTE — Assessment & Plan Note (Signed)
Patient unable to fully articulate what he is taking this medication for due to language issue but previous notes from pcp indicate he is taking it for bladder dysfunction.  Pt needs refill.  Will send in refill today.

## 2020-11-06 NOTE — Assessment & Plan Note (Signed)
BP at goal today. Continue current medications.  

## 2020-11-10 ENCOUNTER — Other Ambulatory Visit: Payer: Self-pay | Admitting: Family Medicine

## 2020-11-10 DIAGNOSIS — I10 Essential (primary) hypertension: Secondary | ICD-10-CM

## 2020-11-11 NOTE — Telephone Encounter (Signed)
Needs annual follow up for HTN for future refills

## 2021-03-21 ENCOUNTER — Other Ambulatory Visit: Payer: Self-pay | Admitting: Family Medicine

## 2021-03-21 DIAGNOSIS — I10 Essential (primary) hypertension: Secondary | ICD-10-CM

## 2021-04-22 ENCOUNTER — Other Ambulatory Visit: Payer: Self-pay

## 2021-04-22 ENCOUNTER — Ambulatory Visit (INDEPENDENT_AMBULATORY_CARE_PROVIDER_SITE_OTHER): Payer: Medicaid Other | Admitting: Family Medicine

## 2021-04-22 DIAGNOSIS — H6123 Impacted cerumen, bilateral: Secondary | ICD-10-CM

## 2021-04-22 DIAGNOSIS — S39012A Strain of muscle, fascia and tendon of lower back, initial encounter: Secondary | ICD-10-CM

## 2021-04-22 MED ORDER — CYCLOBENZAPRINE HCL 5 MG PO TABS: 5.0000 mg | ORAL_TABLET | Freq: Three times a day (TID) | ORAL | 1 refills | Status: DC | PRN

## 2021-04-22 MED ORDER — NAPROXEN 500 MG PO TABS: 500.0000 mg | ORAL_TABLET | Freq: Two times a day (BID) | ORAL | 0 refills | Status: DC

## 2021-04-22 MED ORDER — DEBROX 6.5 % OT SOLN: 5.0000 [drp] | Freq: Two times a day (BID) | OTIC | 0 refills | Status: DC

## 2021-04-22 NOTE — Patient Instructions (Signed)
It was good to see you today.  Thank you for coming in.  I think you have muscle strain in your back and earwax build up.    For the back pain I am prescribing you Naproxen 500 mg twice a day as needed and Flexeril 3 times a day as needed for the pain  If the pain is not better in 3-4 weeks or gets much worse please come back and see [place].  I also recommend using Debrox solution 5 drops in your left ear while laying down on your side as needed.  Be Well, Dr Pecola Leisure

## 2021-04-23 DIAGNOSIS — S39012A Strain of muscle, fascia and tendon of lower back, initial encounter: Secondary | ICD-10-CM | POA: Insufficient documentation

## 2021-04-23 DIAGNOSIS — H6123 Impacted cerumen, bilateral: Secondary | ICD-10-CM | POA: Insufficient documentation

## 2021-04-23 NOTE — Assessment & Plan Note (Addendum)
No likely fracture or other complication given lack of concerning symptoms, normal ambulation and physical exam findings - Naproxen 500 mg twice a day as needed and Flexeril 3 times a day as needed for the pain - follow-up in 3-4 weeks if no improvement or sooner new/worsening symptoms

## 2021-04-23 NOTE — Assessment & Plan Note (Signed)
No other abnormal findings.  TM's unable to be visualized.  Nurse performed flush with some softening and improvement but still significant wax burden.   - Debrox BID, gave patient instructions for using - follow up in 3-4 weeks if no im[provement or new/worsening symptoms

## 2021-04-23 NOTE — Progress Notes (Signed)
    SUBJECTIVE:   CHIEF COMPLAINT / HPI: Ear Pain, Back Pain  Patient presenting with back pain that started 2 weeks ago.  Indicates occurred after jumping over small creek.   Indicates felt landed funny, but did not fall.  No bowel or bladder incontinence.  Has improved some but very slowly over this time period.  Has history of arthritis in knees, but not on any steroid or immunosuppressive therapy.  Has taken Ibuprofen for pain but with little improvement.  Indicates has had issue with let ear since swimming in ocean 2 weeks ago.  Denies any pain or itchiness or systemic symptoms of fever or dizziness.  Has felt like something in ear that has made harder to hear.  PERTINENT  PMH / PSH: None  OBJECTIVE:   BP 122/80   Pulse 90   Ht 5\' 9"  (1.753 m)   Wt 191 lb (86.6 kg)   SpO2 96%   BMI 28.21 kg/m    Ears: Significant wax burden in ears bilaterally, no erythena or tenderness with exam Back- Right lumbar tenderness to palaption, normal ROM, pain with forward flexion and lateral rotation.  Straight leg raise negative.  Ambulates normally without issue  ASSESSMENT/PLAN:   Bilateral impacted cerumen No other abnormal findings.  TM's unable to be visualized.  Nurse performed flush with some softening and improvement but still significant wax burden.   - Debrox BID, gave patient instructions for using - follow up in 3-4 weeks if no im[provement or new/worsening symptoms  Acute myofascial strain of lumbosacral region No likely fracture or other complication given lack of concerning symptoms, normal ambulation and physical exam findings - Naproxen 500 mg twice a day as needed and Flexeril 3 times a day as needed for the pain - follow-up in 3-4 weeks if no im[provement or new/worsening symptoms     , MD [facility]

## 2021-06-14 ENCOUNTER — Other Ambulatory Visit: Payer: Self-pay | Admitting: Family Medicine

## 2021-06-14 DIAGNOSIS — G479 Sleep disorder, unspecified: Secondary | ICD-10-CM

## 2021-09-01 ENCOUNTER — Other Ambulatory Visit: Payer: Self-pay

## 2021-09-01 ENCOUNTER — Emergency Department (HOSPITAL_BASED_OUTPATIENT_CLINIC_OR_DEPARTMENT_OTHER)
Admission: EM | Admit: 2021-09-01 | Discharge: 2021-09-01 | Disposition: A | Discharge status: Home / Self Care | Payer: Medicaid Other | Attending: Emergency Medicine | Admitting: Emergency Medicine

## 2021-09-01 ENCOUNTER — Encounter (HOSPITAL_BASED_OUTPATIENT_CLINIC_OR_DEPARTMENT_OTHER): Payer: Self-pay | Admitting: Emergency Medicine

## 2021-09-01 DIAGNOSIS — Z20822 Contact with and (suspected) exposure to covid-19: Secondary | ICD-10-CM | POA: Insufficient documentation

## 2021-09-01 DIAGNOSIS — I10 Essential (primary) hypertension: Secondary | ICD-10-CM | POA: Insufficient documentation

## 2021-09-01 DIAGNOSIS — H6123 Impacted cerumen, bilateral: Secondary | ICD-10-CM | POA: Insufficient documentation

## 2021-09-01 DIAGNOSIS — J3489 Other specified disorders of nose and nasal sinuses: Secondary | ICD-10-CM | POA: Insufficient documentation

## 2021-09-01 DIAGNOSIS — R509 Fever, unspecified: Secondary | ICD-10-CM | POA: Diagnosis present

## 2021-09-01 DIAGNOSIS — J111 Influenza due to unidentified influenza virus with other respiratory manifestations: Secondary | ICD-10-CM | POA: Diagnosis not present

## 2021-09-01 DIAGNOSIS — Z79899 Other long term (current) drug therapy: Secondary | ICD-10-CM | POA: Insufficient documentation

## 2021-09-01 DIAGNOSIS — J101 Influenza due to other identified influenza virus with other respiratory manifestations: Secondary | ICD-10-CM | POA: Diagnosis not present

## 2021-09-01 DIAGNOSIS — R6889 Other general symptoms and signs: Secondary | ICD-10-CM

## 2021-09-01 LAB — RESP PANEL BY RT-PCR (FLU A&B, COVID) ARPGX2
Influenza A by PCR: NEGATIVE
Influenza B by PCR: NEGATIVE
SARS Coronavirus 2 by RT PCR: NEGATIVE

## 2021-09-01 LAB — GROUP A STREP BY PCR: Group A Strep by PCR: NOT DETECTED

## 2021-09-01 MED ORDER — LIDOCAINE VISCOUS HCL 2 % MT SOLN
15.0000 mL | Freq: Once | OROMUCOSAL | Status: AC
  Administered 2021-09-01: 13:00:00 15 mL via OROMUCOSAL
  Filled 2021-09-01: qty 15

## 2021-09-01 MED ORDER — BENZONATATE 100 MG PO CAPS: 100.0000 mg | ORAL_CAPSULE | Freq: Three times a day (TID) | ORAL | 0 refills | Status: DC

## 2021-09-01 NOTE — ED Notes (Signed)
Pt given juice and graham crackers, per Annia Friendly - RN

## 2021-09-01 NOTE — ED Triage Notes (Signed)
Pt via pov from home with cough and fever since the weekend. Pt states he felt better at one point but feels poorly again. Pt states his fever has been over 100 and that he took ibuprofen this morning for it. Pt reports some productive cough but some not productive. Pt alert & Oriented, nad noted.

## 2021-09-01 NOTE — Discharge Instructions (Addendum)
Seen here today with flulike symptoms with a negative flu test.  Please take over-the-counter cough and cold medication for relief of your symptoms and body aches.  Do not take Tylenol with this as these usually contain Tylenol and you do not want to overdose.  Things like honey and warm tea will help with your sore throat.  Please see additional information on flulike symptoms included this discharge paperwork.  Please follow-up with your PCP for your bilateral cerumen impactions.  If you have any concern, new or worsening symptoms, please return to the nearest emergency department for reevaluation.

## 2021-09-01 NOTE — ED Provider Notes (Signed)
[facility] EMERGENCY DEPT Provider Note   CSN: [id number] Arrival date & time: 09/01/21  1110     History Chief Complaint  Patient presents with   Cough   Fever    Gerald Parker is a 50 y.o. male history of hypertension presents emergency department for 4 to 5 days of fever (T-max 100.8 Fahrenheit), myalgia, productive cough, sore throat, chest pain with cough, rhinorrhea, nasal congestion.  Patient reports he has tried Tylenol and/or ibuprofen that is helped for 3 to 4 hours, but then the body aches and fever returned.  No exacerbating factors.  He denies any shortness of breath or chest pain without coughing.  He denies any nausea, vomiting, abdominal pain, diarrhea, constipation, dysuria, hematuria.  Family members recently diagnosed with flu.  Denies any surgical history.  Daily medications clued spironolactone.  No known drug allergies.  Denies any tobacco, EtOH, or drug use.   Cough Associated symptoms: fever, myalgias, rhinorrhea and sore throat   Associated symptoms: no chest pain, no chills, no ear pain, no headaches, no rash and no shortness of breath   Fever Associated symptoms: congestion, cough, myalgias, rhinorrhea and sore throat   Associated symptoms: no chest pain, no chills, no diarrhea, no dysuria, no ear pain, no headaches, no nausea, no rash and no vomiting       Past Medical History:  Diagnosis Date   ALLERGIC RHINITIS 09/27/2007   Qualifier: Diagnosis of  By: Hassell Done FNP, [facility]     DENTAL CARIES 10/31/2007   Qualifier: Diagnosis of  By: Hassell Done FNP, [facility]     GERD 09/27/2007   Qualifier: Diagnosis of  By: Hassell Done FNP, [facility]     GERD (gastroesophageal reflux disease)    Hydrocele, right 06/28/2012   Hypertension    Numbness and tingling of foot 10/24/2013   Patient has a chronic history of this problem. Has been screened for diabetes in past. Symptoms seem to be related to patient's work.    Scrotal pain 12/19/2013   Patient has an  identical presentation two years ago with a history of hydrocele. Previously was followed by [facility]. Differential includes hydrocele vs spermatocele vs epididymitis vs orchitis. Most likely hydrocele from physical exam and history, although could not illicit transillumination of the scrotum. Wanted GC/chlamydia but it was not obtained. 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 No pertinent surgical history.     Family History  Problem Relation Age of Onset   Hypertension Mother    Diabetes type II Mother    Diabetes type II Father     Social History   Tobacco Use   Smoking status: Never   Smokeless tobacco: Never  Substance Use Topics   Alcohol use: No   Drug use: No    Home Medications Prior to Admission medications   Medication Sig Start Date End Date Taking? Authorizing Provider  benzonatate (TESSALON) 100 MG capsule Take 1 capsule (100 mg total) by mouth every 8 (eight) hours. 09/01/21  Yes Sherrell Puller, PA-C  amitriptyline (ELAVIL) 50 MG tablet TAKE 1 TABLET BY MOUTH AT BEDTIME AS NEEDED FOR SLEEP 06/15/21   Simmons-Robinson, Riki Sheer, MD  carbamide peroxide (DEBROX) 6.5 % OTIC solution Place 5 drops into the left ear 2 (two) times daily. 04/22/21   Maness, Arnette Norris, MD  cyclobenzaprine (FLEXERIL) 5 MG tablet Take 1 tablet (5 mg total) by mouth 3 (three) times daily as needed for muscle spasms. 04/22/21   Maness, Arnette Norris, MD  lisinopril (ZESTRIL) 5 MG tablet TAKE  1 TABLET BY MOUTH ONCE DAILY (NEEDS APPOINTMENT) 03/22/21   Simmons-Robinson, Riki Sheer, MD  naproxen (NAPROSYN) 500 MG tablet Take 1 tablet (500 mg total) by mouth 2 (two) times daily with a meal. 04/22/21   Delora Fuel, MD    Allergies    Patient has no known allergies.  Review of Systems   Review of Systems  Constitutional:  Positive for fever. Negative for chills.  HENT:  Positive for congestion, rhinorrhea and sore throat. Negative for drooling, ear pain and trouble swallowing.   Eyes:  Negative for pain and visual disturbance.  Respiratory:  Positive for cough. Negative for shortness of breath.   Cardiovascular:  Negative for chest pain and palpitations.  Gastrointestinal:  Negative for abdominal pain, constipation, diarrhea, nausea and vomiting.  Genitourinary:  Negative for dysuria and hematuria.  Musculoskeletal:  Positive for myalgias. Negative for arthralgias and back pain.  Skin:  Negative for color change and rash.  Neurological:  Negative for seizures, syncope and headaches.  All other systems reviewed and are negative.  Physical Exam Updated Vital Signs BP (!) 137/104 (BP Location: Right Arm)   Pulse 97   Temp 98.3 F (36.8 C) (Oral)   Resp 18   Ht 5\' 9"  (1.753 m)   Wt 85.7 kg   SpO2 99%   BMI 27.91 kg/m   Physical Exam Vitals and nursing note reviewed.  Constitutional:      General: He is not in acute distress.    Appearance: He is well-developed. He is not toxic-appearing.  HENT:     Head: Normocephalic and atraumatic.     Right Ear: There is impacted cerumen.     Left Ear: There is impacted cerumen.     Nose:     Comments: Bilateral nasal edema and erythema with scant clear nasal discharge.    Mouth/Throat:     Mouth: Mucous membranes are moist.     Comments: Mild pharyngeal erythema without edema or exudate.  Uvula midline.  Airway patent. Eyes:     General: No scleral icterus.    Conjunctiva/sclera: Conjunctivae normal.  Cardiovascular:     Rate  and Rhythm: Normal rate and regular rhythm.     Heart sounds: No murmur heard. Pulmonary:     Effort: Pulmonary effort is normal. No respiratory distress.     Breath sounds: Normal breath sounds.     Comments: Clear to auscultation bilaterally.  No respiratory distress, tripoding, accessory muscle use, nasal flaring or cyanosis.  Speaking full sentences with ease. Abdominal:     Palpations: Abdomen is soft.     Tenderness: There is no abdominal tenderness.  Musculoskeletal:        General: No swelling.     Cervical back: Neck supple. No rigidity.  Lymphadenopathy:     Cervical: No cervical adenopathy.  Skin:    General: Skin is warm and dry.     Capillary Refill: Capillary refill takes less than 2 seconds.  Neurological:     General: No focal deficit  present.     Mental Status: He is alert. Mental status is at baseline.     Gait: Gait normal.  Psychiatric:        Mood and Affect: Mood normal.    ED Results / Procedures / Treatments   Labs (all labs ordered are listed, but only abnormal results are displayed) Labs Reviewed  RESP PANEL BY RT-PCR (FLU A&B, COVID) ARPGX2  GROUP A STREP BY PCR    EKG None  Radiology No results found.  Procedures Procedures   Medications Ordered in ED Medications  lidocaine (XYLOCAINE) 2 % viscous mouth solution 15 mL (15 mLs Mouth/Throat Given 09/01/21 1254)    ED Course  I have reviewed the triage vital signs and the nursing notes.  Pertinent labs & imaging results that were available during my care of the patient were reviewed by me and considered in my medical decision making (see chart for details).  50 year old male presents emergency department with flulike symptoms for the past 4 to 5 days.  Patient's family members recently diagnosed with flu.  Respiratory panel negative for flu and COVID.  Strep negative.  Given the duration of symptoms, patient is not a candidate for Tamiflu.  Patient's vital signs stable.  Afebrile in the  emergency department.  Mildly hypertensive, known problem to patient.  Patient is comfortable appearing asking for food and drink while in the emergency department.  Lidocaine given for sore throat.  Patient reports he is found relief with that.  Tessalon Perles prescribed.  Over-the-counter cough and cold medication recommended.  Recommended warm drinks and honey for throat pain.  Warnings against using this with Tylenol.  Recommended follow-up with PCP for cerumen impaction removal.  Return precautions given.  Patient agrees to plan.  Patient is stable be discharged home in good condition.    MDM Rules/Calculators/A&P   Final Clinical Impression(s) / ED Diagnoses Final diagnoses:  Flu-like symptoms    Rx / DC Orders ED Discharge Orders          Ordered    benzonatate (TESSALON) 100 MG capsule  Every 8 hours        09/01/21 1337             Achille Rich, PA-C 09/01/21 1340    Arby Barrette, MD 09/02/21 1126

## 2021-09-01 NOTE — ED Notes (Signed)
Unable to get discharge vitals because pt went to the car (according to pt's wife).

## 2021-09-02 ENCOUNTER — Telehealth: Payer: Self-pay

## 2021-09-02 NOTE — Telephone Encounter (Signed)
Transition Care Management Unsuccessful Follow-up Telephone Call  Date of discharge and from where:  09/01/2021-[facility]  Attempts:  1st Attempt  Reason for unsuccessful TCM follow-up call:  Left voice message

## 2021-09-05 NOTE — Telephone Encounter (Signed)
Transition Care Management Follow-up Telephone Call Date of discharge and from where: 09/01/2021 from [facility] How have you been since you were released from the hospital? Pt stated that he feeling better and was able to pick up rx from the ED. Pt did not have any questions or concerns.  Any questions or concerns? No  Items Reviewed: Did the pt receive and understand the discharge instructions provided? Yes  Medications obtained and verified? Yes  Other? No  Any new allergies since your discharge? No  Dietary orders reviewed? No Do you have support at home? Yes   Functional Questionnaire: (I = Independent and D = Dependent) ADLs: I  Bathing/Dressing- I  Meal Prep- I  Eating- I  Maintaining continence- I  Transferring/Ambulation- I  Managing Meds- I   Follow up appointments reviewed:  PCP Hospital f/u appt confirmed? No   Specialist Hospital f/u appt confirmed? No   Are transportation arrangements needed? No  If their condition worsens, is the pt aware to call PCP or go to the Emergency Dept.? Yes Was the patient provided with contact information for the PCP's office or ED? Yes Was to pt encouraged to call back with questions or concerns? Yes

## 2021-09-19 ENCOUNTER — Other Ambulatory Visit: Payer: Self-pay | Admitting: Family Medicine

## 2021-09-19 DIAGNOSIS — G479 Sleep disorder, unspecified: Secondary | ICD-10-CM

## 2021-09-19 DIAGNOSIS — I10 Essential (primary) hypertension: Secondary | ICD-10-CM

## 2021-12-14 ENCOUNTER — Other Ambulatory Visit: Payer: Self-pay | Admitting: Family Medicine

## 2021-12-14 DIAGNOSIS — I10 Essential (primary) hypertension: Secondary | ICD-10-CM

## 2021-12-17 ENCOUNTER — Emergency Department (HOSPITAL_BASED_OUTPATIENT_CLINIC_OR_DEPARTMENT_OTHER)
Admission: EM | Admit: 2021-12-17 | Discharge: 2021-12-17 | Disposition: A | Discharge status: Home / Self Care | Payer: Medicaid Other | Attending: Emergency Medicine | Admitting: Emergency Medicine

## 2021-12-17 ENCOUNTER — Encounter (HOSPITAL_BASED_OUTPATIENT_CLINIC_OR_DEPARTMENT_OTHER): Payer: Self-pay | Admitting: Emergency Medicine

## 2021-12-17 ENCOUNTER — Other Ambulatory Visit: Payer: Self-pay

## 2021-12-17 DIAGNOSIS — M545 Low back pain, unspecified: Secondary | ICD-10-CM | POA: Diagnosis not present

## 2021-12-17 DIAGNOSIS — Z79899 Other long term (current) drug therapy: Secondary | ICD-10-CM | POA: Insufficient documentation

## 2021-12-17 DIAGNOSIS — X501XXA Overexertion from prolonged static or awkward postures, initial encounter: Secondary | ICD-10-CM | POA: Diagnosis not present

## 2021-12-17 DIAGNOSIS — M5459 Other low back pain: Secondary | ICD-10-CM | POA: Diagnosis not present

## 2021-12-17 DIAGNOSIS — I10 Essential (primary) hypertension: Secondary | ICD-10-CM | POA: Insufficient documentation

## 2021-12-17 MED ORDER — METHOCARBAMOL 500 MG PO TABS: 500.0000 mg | ORAL_TABLET | Freq: Two times a day (BID) | ORAL | 0 refills | Status: DC

## 2021-12-17 NOTE — ED Triage Notes (Signed)
Pt endorses left lower back pain for 4 days. Pain worse when bending of laying on back.  ?

## 2021-12-17 NOTE — ED Notes (Signed)
Ice pack given

## 2021-12-17 NOTE — Discharge Instructions (Signed)
Please use Tylenol or ibuprofen for pain.  You may use 600 mg ibuprofen every 6 hours or 1000 mg of Tylenol every 6 hours.  You may choose to alternate between the 2.  This would be most effective.  Not to exceed 4 g of Tylenol within 24 hours.  Not to exceed 3200 mg ibuprofen 24 hours. ? ?You can use the muscle relaxant in addition to the above up to twice daily.  It may make you slightly drowsy, please use caution before taking this medication piloting a motor vehicle or operating any heavy machinery.  If you have persistent symptoms after 1 to 2 weeks I recommend that you follow-up with orthopedics as we discussed.  If you have new numbness, tingling, or loss of sensation in your groin, or incontinence of urine or bowels please return to the emergency department for further evaluation. ?

## 2021-12-17 NOTE — ED Provider Notes (Signed)
?[facility] EMERGENC[facility] DEPT ?Provider Note ? ? ?CSN: [id number] ?Arrival date & time: 12/17/21  1154 ? ?  ? ?History ? ?Chief Complaint  ?Patient presents with  ? Back Pain  ? ? ?Gerald Parker is a 51 y.o. male with past medical history significant for hypertension, some arthritis who presents with left lower back pain for around 4 days.  Patient reports that it is worse when bending or laying on back.  Patient reports that he was jumping across a creek and landed the middle of the [facility] which triggered the pain.  He denies any numbness, tingling of his legs.  He has been able to walk without difficulty.  He denies history of IV drug use, chronic corticosteroid use, cancer, recent fever, or pain waking him up from sleep.  He has not tried anything for pain at this time. ? ? ?Back Pain ? ?  ? ?Home Medications ?Prior to Admission medications   ?Medication Sig Start Date End Date Taking? Authorizing Provider  ?methocarbamol (ROBAXIN) 500 MG tablet Take 1 tablet (500 mg total) by mouth 2 (two) times daily. 12/17/21  Yes Karthik Whittinghill H, PA-C  ?amitriptyline (ELAVIL) 50 MG tablet TAKE 1 TABLET BY MOUTH AT BEDTIME AS NEEDED FOR SLEEP 09/19/21   Simmons-Robinson, Riki Sheer, MD  ?benzonatate (TESSALON) 100 MG capsule Take 1 capsule (100 mg total) by mouth every 8 (eight) hours. 09/01/21   Sherrell Puller, PA-C  ?carbamide peroxide (DEBROX) 6.5 % OTIC solution Place 5 drops into the left ear 2 (two) times daily. 04/22/21   Maness, Arnette Norris, MD  ?cyclobenzaprine (FLEXERIL) 5 MG tablet Take 1 tablet (5 mg total) by mouth 3 (three) times daily as needed for muscle spasms. 04/22/21   Maness, Arnette Norris, MD  ?lisinopril (ZESTRIL) 5 MG tablet NEEDS APPT FOR FUTURE REFILLS!!! TAKE 1 TABLET BY MOUTH ONCE DAILY 12/14/21   Gladys Damme, MD  ?naproxen (NAPROSYN) 500 MG tablet Take 1 tablet (500 mg total) by mouth 2 (two) times daily with a meal. 04/22/21   Delora Fuel, MD  ?   ? ?Allergies    ?Patient has no known allergies.    ? ?Review of Systems   ?Review of Systems  ?Musculoskeletal:  Positive for back pain.  ?All other systems reviewed and are negative. ? ?Physical Exam ?Updated Vital Signs ?BP (!) 134/94 (BP Location: Left Arm)   Pulse 91   Temp 98.5 ?F (36.9 ?C)   Resp 18   Ht 5\' 8"  (1.727 m)   Wt 86.2 kg   SpO2 95%   BMI 28.89 kg/m?  ?Physical Exam ?Vitals and nursing note reviewed.  ?Constitutional:   ?   General: He is not in acute distress. ?   Appearance: Normal appearance.  ?HENT:  ?   Head: Normocephalic and atraumatic.  ?Eyes:  ?   General:     ?   Right eye: No discharge.     ?   Left eye: No discharge.  ?Cardiovascular:  ?   Rate and Rhythm: Normal rate and regular rhythm.  ?Pulmonary:  ?   Effort: Pulmonary effort is normal. No respiratory distress.  ?Musculoskeletal:     ?   General: No deformity.  ?   Comments: Tenderness to palpation in the left paraspinous lumbar region.  No tenderness palpation midline spine from cervical through lumbar.  Intact strength 5 out of 5 bilateral lower extremities.  Romberg negative, gait normal.  ?Skin: ?   General: Skin is warm and dry.  ?Neurological:  ?  Mental Status: He is alert and oriented to person, place, and time.  ?Psychiatric:     ?   Mood and Affect: Mood normal.     ?   Behavior: Behavior normal.  ? ? ?ED Results / Procedures / Treatments   ?Labs ?(all labs ordered are listed, but only abnormal results are displayed) ?Labs Reviewed - No data to display ? ?EKG ?None ? ?Radiology ?No results found. ? ?Procedures ?Procedures  ? ? ?Medications Ordered in ED ?Medications - No data to display ? ?ED Course/ Medical Decision Making/ A&P ?  ?                        ?Medical Decision Making ? ?This is a 51 year old male with left lower back pain secondary to known insult from jumping across a creek.  He has no neurologic deficits, no vascular deficits noted.  He has intact strength of bilateral lower extremities.  His pain is consistent with paraspinous lumbar strain.  He  has no history of chronic corticosteroid use, IV drug use, cancer, recent fever, or pain waking him up from sleep.  Do not believe that any advanced radiographic imaging is necessary at this time.  This patient is ambulatory, and without signs and symptoms of cauda equina, numbness in groin, urinary or fecal incontinence.  Encouraged supportive care with ibuprofen, Tylenol, ice, rest, will add muscle relaxant for additional pain relief.  Encourage follow-up with orthopedics if symptoms do not improve in 1 to 2 weeks.  Patient discharged in stable condition at this time, return precautions given. ?Final Clinical Impression(s) / ED Diagnoses ?Final diagnoses:  ?Acute left-sided low back pain without sciatica  ? ? ?Rx / DC Orders ?ED Discharge Orders   ? ?      Ordered  ?  methocarbamol (ROBAXIN) 500 MG tablet  2 times daily       ? 12/17/21 1259  ? ?  ?  ? ?  ? ? ?  ?Riya Huxford H, PA-C ?12/17/21 1300 ? ?  ?Sherwood Gambler, MD ?12/18/21 [phone] ? ?

## 2021-12-19 ENCOUNTER — Telehealth: Payer: Self-pay

## 2021-12-19 NOTE — Telephone Encounter (Signed)
Transition Care Management Follow-up Telephone Call ?Date of discharge and from where: 12/17/2021 from [place] ?How have you been since you were released from the hospital? Patient stated that he is feeling better and did not have any questions or concerns at this time.  ?Any questions or concerns? No ? ?Items Reviewed: ?Did the pt receive and understand the discharge instructions provided? Yes  ?Medications obtained and verified? Yes  ?Other? No  ?Any new allergies since your discharge? No  ?Dietary orders reviewed? No ?Do you have support at home? Yes  ? ?Functional Questionnaire: (I = Independent and D = Dependent) ?ADLs: I ? ?Bathing/Dressing- I ? ?Meal Prep- I ? ?Eating- I ? ?Maintaining continence- I ? ?Transferring/Ambulation- I ? ?Managing Meds- I ? ? ?Follow up appointments reviewed: ? ?PCP Hospital f/u appt confirmed? Yes  Scheduled to see Lurline Del, DO on 12/23/2021 @ 02:30pm. ?[facility] f/u appt confirmed? No   ?Are transportation arrangements needed? No  ?If their condition worsens, is the pt aware to call PCP or go to the Emergency Dept.? Yes ?Was the patient provided with contact information for the PCP's office or ED? Yes ?Was to pt encouraged to call back with questions or concerns? Yes ? ?

## 2021-12-22 NOTE — Progress Notes (Signed)
? ? ?  SUBJECTIVE:  ? ?CHIEF COMPLAINT / HPI:  ? ?BP Check: ?Home medications include lisinopril 5 mg daily. He endorses no missed doses.  ? ?Hypertriglyceridemia: ?Last checked in 2018 with triglyceride level of 441.  Patient not currently on any medications for this.  We will check lipid panel today ? ?Right knee pain: ?States he had an arthroscopic procedure in his knee years ago. Says it started getting worse over the last few years. No recent injury. He is not using any medications on the knee.  ? ?PERTINENT  PMH / PSH: Hypertension ? ?OBJECTIVE:  ? ?BP (!) 135/97   Pulse 83   Ht 5\' 8"  (1.727 m)   Wt 191 lb 2 oz (86.7 kg)   SpO2 99%   BMI 29.06 kg/m?  ? ? ?General: NAD, pleasant, able to participate in exam ?Respiratory: No respiratory distress ?MSK: Knee, right: Inspection was negative for erythema, ecchymosis, and effusion. No obvious bony abnormalities or signs of osteophyte development. Palpation yielded no asymmetric warmth; mild joint line tenderness; No condyle tenderness; No patellar tenderness; mild patellar crepitus. Patellar and quadriceps tendons unremarkable, and no tenderness of the pes anserine bursa. No obvious Baker's cyst development. ROM normal in flexion (135 degrees) and extension (0 degrees). Normal hamstring and quadriceps strength. Neurovascularly intact bilaterally. ? ?Psych: Normal affect and mood ? ?ASSESSMENT/PLAN:  ? ?Essential hypertension: ?BP today of 135/97. Current meds include lisinopril 5 mg daily.  He has not missed any doses.  Will increase to 10 mg daily. Will check BMP.  Follow-up in 1 week for BMP and blood pressure check ? ?Hypertriglyceridemia: ?Previous triglycerides of 441 in 2018.  Does not appear to had a lipid panel since then.  We will check lipid panel today.  Noted this in the afternoon and the patient is not fasting. ? ?Health maintenance items include recommendation for hep C screening.  We will check this today. ? ?2019, DO ?[facility]  ? ? ? ?

## 2021-12-23 ENCOUNTER — Encounter: Payer: Self-pay | Admitting: Family Medicine

## 2021-12-23 ENCOUNTER — Other Ambulatory Visit: Payer: Self-pay

## 2021-12-23 ENCOUNTER — Ambulatory Visit (INDEPENDENT_AMBULATORY_CARE_PROVIDER_SITE_OTHER): Payer: Medicaid Other | Admitting: Family Medicine

## 2021-12-23 VITALS — BP 135/97 | HR 83 | Ht 68.0 in | Wt 191.1 lb

## 2021-12-23 DIAGNOSIS — I10 Essential (primary) hypertension: Secondary | ICD-10-CM | POA: Diagnosis not present

## 2021-12-23 DIAGNOSIS — E781 Pure hyperglyceridemia: Secondary | ICD-10-CM

## 2021-12-23 MED ORDER — LISINOPRIL 5 MG PO TABS: ORAL_TABLET | ORAL | 1 refills | Status: DC

## 2021-12-23 MED ORDER — LISINOPRIL 10 MG PO TABS: ORAL_TABLET | ORAL | 1 refills | Status: DC

## 2021-12-23 NOTE — Patient Instructions (Addendum)
I want you to increase your lisinopril to 10 mg daily.  I sent in a new prescription with 10 mg tablets. ?I will see you back in 1 week for blood pressure check.  We will check some labs today and we will recheck them in 1 week. ? ?For your cholesterol rechecking labs today.  We are also checking for hepatitis C which is recommended. ? ?For your knee pain this seems consistent with arthritis.  I would like for you to try over-the-counter diclofenac/Voltaren gel  3-4 times per day for the pain.  Try this for 1 month and let us know how the pain is improving.  If it continues to worsen we can get you in with an orthopedic surgeon to discuss additional options. ?

## 2021-12-24 LAB — LIPID PANEL
Chol/HDL Ratio: 6 ratio — ABNORMAL HIGH (ref 0.0–5.0)
Cholesterol, Total: 246 mg/dL — ABNORMAL HIGH (ref 100–199)
HDL: 41 mg/dL (ref >39)
LDL Chol Calc (NIH): 158 mg/dL — ABNORMAL HIGH (ref 0–99)
Triglycerides: 251 mg/dL — ABNORMAL HIGH (ref 0–149)
VLDL Cholesterol Cal: 47 mg/dL — ABNORMAL HIGH (ref 5–40)

## 2021-12-24 LAB — BASIC METABOLIC PANEL
BUN/Creatinine Ratio: 10 (ref 9–20)
BUN: 9 mg/dL (ref 6–24)
CO2: 20 mmol/L (ref 20–29)
Calcium: 9.6 mg/dL (ref 8.7–10.2)
Chloride: 101 mmol/L (ref 96–106)
Creatinine, Ser: 0.9 mg/dL (ref 0.76–1.27)
Glucose: 87 mg/dL (ref 70–99)
Potassium: 4.7 mmol/L (ref 3.5–5.2)
Sodium: 136 mmol/L (ref 134–144)
eGFR: 103 mL/min/{1.73_m2} (ref >59)

## 2021-12-27 NOTE — Progress Notes (Signed)
? ? ?  SUBJECTIVE:  ? ?CHIEF COMPLAINT / HPI:  ? ?Hypertension follow-up: ?At previous appointment patient had elevated blood pressure 135/97 on lisinopril 5 mg daily.  This was increased to 10 mg daily.  Patient was recommended for 1 week follow-up for BMP and blood pressure check.  Today he states he is taking his blood pressure medicine 10 mg daily.  He does not have a blood pressure cuff at home and does not have any family members with 1 to check. ? ?Hypertriglyceridemia  hyperlipidemia: ?Patient had previous triglycerides of 441 in 2018, we checked a lipid panel on 12/23/2021 which showed triglycerides of 251.  This was not a fasting lab.  Patient was also noted to have elevated LDL of 158.  ASCVD risk score of 11.9%. ? ?PERTINENT  PMH / PSH: Hypertension ? ?OBJECTIVE:  ? ?BP (!) 142/100   Pulse 94   Wt 194 lb 6.4 oz (88.2 kg)   SpO2 100%   BMI 29.56 kg/m?   ? ?Manual recheck 138/98 ? ?General: NAD, pleasant, able to participate in exam ?Respiratory: No respiratory distress ?Psych: Normal affect and mood ? ?ASSESSMENT/PLAN:  ? ?Hypertension: ?At previous appointment patient's lisinopril was increased from 5 mg daily to 10 mg daily.  Blood pressure today of 138/98.  Will increase lisinopril to 20 mg daily.  We will check a BMP.  He will come back in 1 week for BP check and repeat BMP ? ?Hypertriglyceridemia  hyperlipidemia: ?Nonfasting triglycerides at last appointment with 251, LDL is elevated at 158.  ASCVD of 11.9%.  Discussed recommendation to initiate statin.  We will start atorvastatin 40 mg daily.  Recheck direct LDL in 3 months ? ?Jackelyn Poling, DO ?[facility]  ? ? ? ?

## 2021-12-29 ENCOUNTER — Other Ambulatory Visit: Payer: Self-pay

## 2021-12-29 ENCOUNTER — Ambulatory Visit: Payer: Medicaid Other | Admitting: Family Medicine

## 2021-12-29 ENCOUNTER — Encounter: Payer: Self-pay | Admitting: Family Medicine

## 2021-12-29 VITALS — BP 142/100 | HR 94 | Wt 194.4 lb

## 2021-12-29 DIAGNOSIS — I10 Essential (primary) hypertension: Secondary | ICD-10-CM

## 2021-12-29 DIAGNOSIS — E785 Hyperlipidemia, unspecified: Secondary | ICD-10-CM

## 2021-12-29 MED ORDER — LISINOPRIL 10 MG PO TABS: 20.0000 mg | ORAL_TABLET | Freq: Every day | ORAL | 1 refills | Status: DC

## 2021-12-29 MED ORDER — ATORVASTATIN CALCIUM 40 MG PO TABS: 40.0000 mg | ORAL_TABLET | Freq: Every day | ORAL | 3 refills | Status: DC

## 2021-12-29 NOTE — Patient Instructions (Signed)
I want you to increase your lisinopril to 20 mg daily.  We will check lab today and I will let you know if we need to do anything different based off of it.  I like to see back in 1 week for blood pressure check as well as repeat lab work. ? ?For your cholesterol with a risk calculator you have an 11.9% chance of having a heart attack or stroke statistically over the next 10 years.  Because of this we are starting a cholesterol medicine.  We should recheck your cholesterol level in about 3 months. ?

## 2021-12-30 ENCOUNTER — Ambulatory Visit: Payer: Medicaid Other | Admitting: Family Medicine

## 2021-12-30 LAB — BASIC METABOLIC PANEL
BUN/Creatinine Ratio: 11 (ref 9–20)
BUN: 11 mg/dL (ref 6–24)
CO2: 22 mmol/L (ref 20–29)
Calcium: 9.3 mg/dL (ref 8.7–10.2)
Chloride: 102 mmol/L (ref 96–106)
Creatinine, Ser: 0.96 mg/dL (ref 0.76–1.27)
Glucose: 143 mg/dL — ABNORMAL HIGH (ref 70–99)
Potassium: 4.7 mmol/L (ref 3.5–5.2)
Sodium: 139 mmol/L (ref 134–144)
eGFR: 95 mL/min/{1.73_m2} (ref >59)

## 2022-01-02 ENCOUNTER — Encounter: Payer: Self-pay | Admitting: Family Medicine

## 2022-01-12 NOTE — Progress Notes (Deleted)
? ? ?  SUBJECTIVE:  ? ?CHIEF COMPLAINT / HPI:  ? ?Hypertension-follow-up: ?At last appointment patient's blood pressure was 138/98.  We increased his lisinopril to 20 mg daily.  We checked a BMP which showed normal creatinine.  Today he presents for follow-up. ? ?PERTINENT  PMH / PSH: *** ? ?OBJECTIVE:  ? ?There were no vitals taken for this visit. *** ? ?General: NAD, pleasant, able to participate in exam ?Respiratory: No respiratory distress ?Skin: warm and dry, no rashes noted ?Psych: Normal affect and mood  ? ?ASSESSMENT/PLAN:  ? ?No problem-specific Assessment & Plan notes found for this encounter. ?  ?Hypertension: ?Blood pressure today of***.  Continues on lisinopril 40 mg daily.  Will check BMP. ? ?Jackelyn Poling, DO ?[facility]  ? ? ?{    This will disappear when note is signed, click to select method of visit    :1} ?

## 2022-01-13 ENCOUNTER — Ambulatory Visit (INDEPENDENT_AMBULATORY_CARE_PROVIDER_SITE_OTHER): Payer: Medicaid Other | Admitting: Family Medicine

## 2022-01-13 ENCOUNTER — Other Ambulatory Visit: Payer: Self-pay | Admitting: Family Medicine

## 2022-01-13 ENCOUNTER — Ambulatory Visit: Payer: Medicaid Other | Admitting: Family Medicine

## 2022-01-13 ENCOUNTER — Encounter: Payer: Self-pay | Admitting: Family Medicine

## 2022-01-13 VITALS — BP 137/96 | HR 79 | Ht 68.0 in | Wt 189.6 lb

## 2022-01-13 DIAGNOSIS — G479 Sleep disorder, unspecified: Secondary | ICD-10-CM | POA: Diagnosis not present

## 2022-01-13 DIAGNOSIS — I10 Essential (primary) hypertension: Secondary | ICD-10-CM | POA: Diagnosis not present

## 2022-01-13 MED ORDER — ROSUVASTATIN CALCIUM 10 MG PO TABS: 10.0000 mg | ORAL_TABLET | Freq: Every day | ORAL | 3 refills | Status: DC

## 2022-01-13 MED ORDER — LISINOPRIL 10 MG PO TABS: 20.0000 mg | ORAL_TABLET | Freq: Every day | ORAL | 3 refills | Status: DC

## 2022-01-13 MED ORDER — AMITRIPTYLINE HCL 50 MG PO TABS: 50.0000 mg | ORAL_TABLET | Freq: Every evening | ORAL | 0 refills | Status: DC | PRN

## 2022-01-13 MED ORDER — LISINOPRIL 20 MG PO TABS: 20.0000 mg | ORAL_TABLET | Freq: Every day | ORAL | 3 refills | Status: DC

## 2022-01-13 NOTE — Patient Instructions (Signed)
Your blood pressure is excellent today.  Continue taking the medicine as prescribed.  I am switching your atorvastatin to rosuvastatin.  I sent this to your pharmacy.  Stop the atorvastatin you can start the rosuvastatin.  If you have any muscle aches or pains on this medicine please let me know. ?

## 2022-01-13 NOTE — Progress Notes (Addendum)
? ? ?  SUBJECTIVE:  ? ?CHIEF COMPLAINT / HPI:  ? ?Hypertension-follow-up: ?At last appointment patient's blood pressure was 138/98.  We increased his lisinopril to 20 mg daily.  We checked a BMP which showed normal creatinine.  Today he presents for follow-up. ? ?Hyperlipidemia: ?Had feeling unwell and fatigued with some muscle pain while on atorvastatin, stopped the med and it improved. He is interested in trying an alternative.  ? ?PERTINENT  PMH / PSH: HTN ? ?OBJECTIVE:  ? ?BP (!) 137/96   Pulse 79   Ht 5\' 8"  (1.727 m)   Wt 189 lb 9.6 oz (86 kg)   SpO2 99%   BMI 28.83 kg/m?   ? ?128/82 on recheck ? ?General: NAD, pleasant, able to participate in exam ?Respiratory: No respiratory distress ?Skin: warm and dry, no rashes noted ?Psych: Normal affect and mood ? ?ASSESSMENT/PLAN:  ? ?  ?Hypertension:  ?Blood pressure today of 128/82.  Continues on lisinopril 40 mg daily.  Will check BMP. ? ?Hyperlipidemia: ?Patient recently started atorvastatin but developed some myalgias and fatigue while on the medicine.  He states he stopped and these symptoms resolved.  He request another medication because he understands importance of treatment of hyper lipidemia.  We will try Crestor.  If he develops any concerns or issues with this he is going let me know. ? ? , DO ?[facility]  ? ? ? ?

## 2022-01-14 LAB — BASIC METABOLIC PANEL
BUN/Creatinine Ratio: 10 (ref 9–20)
BUN: 10 mg/dL (ref 6–24)
CO2: 21 mmol/L (ref 20–29)
Calcium: 9.4 mg/dL (ref 8.7–10.2)
Chloride: 99 mmol/L (ref 96–106)
Creatinine, Ser: 1 mg/dL (ref 0.76–1.27)
Glucose: 96 mg/dL (ref 70–99)
Potassium: 4.6 mmol/L (ref 3.5–5.2)
Sodium: 137 mmol/L (ref 134–144)
eGFR: 91 mL/min/{1.73_m2} (ref >59)

## 2022-01-16 ENCOUNTER — Encounter: Payer: Self-pay | Admitting: Family Medicine

## 2022-07-07 ENCOUNTER — Other Ambulatory Visit: Payer: Self-pay

## 2022-07-07 DIAGNOSIS — G479 Sleep disorder, unspecified: Secondary | ICD-10-CM

## 2022-07-07 MED ORDER — AMITRIPTYLINE HCL 50 MG PO TABS: 50.0000 mg | ORAL_TABLET | Freq: Every evening | ORAL | 0 refills | Status: DC | PRN

## 2022-10-13 ENCOUNTER — Other Ambulatory Visit: Payer: Self-pay | Admitting: Family Medicine

## 2022-10-13 DIAGNOSIS — G479 Sleep disorder, unspecified: Secondary | ICD-10-CM

## 2022-10-19 ENCOUNTER — Other Ambulatory Visit: Payer: Self-pay | Admitting: Family Medicine

## 2022-10-19 DIAGNOSIS — G479 Sleep disorder, unspecified: Secondary | ICD-10-CM

## 2022-11-03 ENCOUNTER — Encounter (INDEPENDENT_AMBULATORY_CARE_PROVIDER_SITE_OTHER): Payer: Medicaid Other | Admitting: Ophthalmology

## 2023-01-18 ENCOUNTER — Other Ambulatory Visit: Payer: Self-pay | Admitting: Family Medicine

## 2023-01-18 DIAGNOSIS — G479 Sleep disorder, unspecified: Secondary | ICD-10-CM

## 2023-01-19 ENCOUNTER — Other Ambulatory Visit: Payer: Self-pay

## 2023-01-19 DIAGNOSIS — I10 Essential (primary) hypertension: Secondary | ICD-10-CM

## 2023-01-19 DIAGNOSIS — E7849 Other hyperlipidemia: Secondary | ICD-10-CM

## 2023-01-19 MED ORDER — LISINOPRIL 20 MG PO TABS: 20.0000 mg | ORAL_TABLET | Freq: Every day | ORAL | 0 refills | Status: DC

## 2023-01-19 MED ORDER — ROSUVASTATIN CALCIUM 10 MG PO TABS: 10.0000 mg | ORAL_TABLET | Freq: Every day | ORAL | 0 refills | Status: DC

## 2023-01-20 ENCOUNTER — Other Ambulatory Visit: Payer: Self-pay | Admitting: Family Medicine

## 2023-01-20 DIAGNOSIS — G479 Sleep disorder, unspecified: Secondary | ICD-10-CM

## 2023-01-24 ENCOUNTER — Other Ambulatory Visit: Payer: Self-pay

## 2023-01-24 DIAGNOSIS — G479 Sleep disorder, unspecified: Secondary | ICD-10-CM

## 2023-02-12 ENCOUNTER — Other Ambulatory Visit: Payer: Self-pay | Admitting: Family Medicine

## 2023-02-12 DIAGNOSIS — G479 Sleep disorder, unspecified: Secondary | ICD-10-CM

## 2023-02-23 ENCOUNTER — Ambulatory Visit: Payer: Medicaid Other | Admitting: Family Medicine

## 2023-02-27 ENCOUNTER — Ambulatory Visit (INDEPENDENT_AMBULATORY_CARE_PROVIDER_SITE_OTHER): Payer: Medicaid Other | Admitting: Family Medicine

## 2023-02-27 ENCOUNTER — Encounter: Payer: Self-pay | Admitting: Family Medicine

## 2023-02-27 VITALS — BP 122/86 | HR 74 | Ht 68.0 in | Wt 188.0 lb

## 2023-02-27 DIAGNOSIS — E7849 Other hyperlipidemia: Secondary | ICD-10-CM

## 2023-02-27 DIAGNOSIS — G479 Sleep disorder, unspecified: Secondary | ICD-10-CM | POA: Diagnosis not present

## 2023-02-27 DIAGNOSIS — H6123 Impacted cerumen, bilateral: Secondary | ICD-10-CM

## 2023-02-27 DIAGNOSIS — I10 Essential (primary) hypertension: Secondary | ICD-10-CM

## 2023-02-27 MED ORDER — AMITRIPTYLINE HCL 50 MG PO TABS: ORAL_TABLET | ORAL | 0 refills | Status: DC

## 2023-02-27 MED ORDER — ROSUVASTATIN CALCIUM 10 MG PO TABS: 10.0000 mg | ORAL_TABLET | Freq: Every day | ORAL | 0 refills | Status: DC

## 2023-02-27 MED ORDER — LISINOPRIL 20 MG PO TABS: 20.0000 mg | ORAL_TABLET | Freq: Every day | ORAL | 0 refills | Status: DC

## 2023-02-27 NOTE — Progress Notes (Signed)
    SUBJECTIVE:   CHIEF COMPLAINT / HPI:   Med refill  Patient said he would like refill of his amitriptyline, lisinopril, rosuvastatin.  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  Denies any symptoms of urinary retention, dizziness, dry eyes or mouth.   Congested left ear  Patient reports that his left ear has started to feel more congested and that he is had muffled hearing in that side.  Says that his this has happened to him before and he came to clinic and had his ears irrigated.  Patient says that he has been using Q-tips and messing with his ears a lot per his words.  Did not try the Debrox that was prescribed last time.  Denies dizziness, tinnitus.   PERTINENT  PMH / PSH: H/o impacted cerumen   OBJECTIVE:   BP 122/86   Pulse 74   Ht 5\' 8"  (1.727 m)   Wt 188 lb (85.3 kg)   SpO2 98%   BMI 28.59 kg/m   General: well appearing, in no acute distress\ HEENT: Lateral ear canals with dark impacted cerumen, no tenderness when maneuvering the pinna, no eye dryness or dry mucosal membranes CV: RRR, radial pulses equal and palpable, no BLE edema  Resp: Normal work of breathing on room air, CTAB Abd: Soft, non tender, non distended  Neuro: Alert & Oriented x 4, no truncal ataxia   ASSESSMENT/PLAN:   Bilateral impacted cerumen Assessment & Plan: Patient has history of bilateral impacted cerumen.  He uses Q-tips and most likely is pushing his cerumen further inside and causing impaction. - Irrigation completed today with resolution of symptoms.  Exam with clear ear canals bilaterally after irrigation - Educated patient to avoid using Q-tips, recommended Debrox drops once weekly if patient continues to have impaction.  Med Refill  Refilled lisinopril and Crestor.  Also refilled amitriptyline for difficulty sleeping.  Patient has been on this for a long time and helps him sleep.  Patient is not having any side effects of  anticholinergic side effects of amitriptyline thus okay to continue at this time.     Lockie Mola, MD [facility]

## 2023-02-27 NOTE — Patient Instructions (Signed)
It was wonderful to see you today.  Please bring ALL of your medications with you to every visit.   Today we talked about:  Medication refill - I sent in the amitriptyline, lisinopril, and Crestor   Ear congestion - Please avoid using q-tips. You can use a towel and dip it in mineral oil. Drop one or two drops into each ear, let it soften the ear wax and wipe it away. Otherwise you can use an over the counter ear drop called Debrox.   Please follow up as needed.   Thank you for choosing [facility].   Please call [phone] with any questions about today's appointment.  Lockie Mola, MD  Family Medicine

## 2023-02-27 NOTE — Assessment & Plan Note (Signed)
Patient has history of bilateral impacted cerumen.  He uses Q-tips and most likely is pushing his cerumen further inside and causing impaction. - Irrigation completed today with resolution of symptoms.  Exam with clear ear canals bilaterally after irrigation - Educated patient to avoid using Q-tips, recommended Debrox drops once weekly if patient continues to have impaction.

## 2023-04-13 DIAGNOSIS — H5213 Myopia, bilateral: Secondary | ICD-10-CM | POA: Diagnosis not present

## 2023-04-19 ENCOUNTER — Other Ambulatory Visit: Payer: Self-pay | Admitting: Family Medicine

## 2023-04-19 DIAGNOSIS — G479 Sleep disorder, unspecified: Secondary | ICD-10-CM

## 2023-04-19 DIAGNOSIS — I10 Essential (primary) hypertension: Secondary | ICD-10-CM

## 2023-05-13 ENCOUNTER — Other Ambulatory Visit: Payer: Self-pay | Admitting: Family Medicine

## 2023-05-13 DIAGNOSIS — G479 Sleep disorder, unspecified: Secondary | ICD-10-CM

## 2023-07-24 ENCOUNTER — Other Ambulatory Visit: Payer: Self-pay | Admitting: Family Medicine

## 2023-07-24 DIAGNOSIS — I10 Essential (primary) hypertension: Secondary | ICD-10-CM

## 2023-07-24 DIAGNOSIS — E7849 Other hyperlipidemia: Secondary | ICD-10-CM

## 2023-07-27 ENCOUNTER — Ambulatory Visit: Payer: Medicaid Other | Admitting: Family Medicine

## 2023-07-27 ENCOUNTER — Encounter: Payer: Self-pay | Admitting: Family Medicine

## 2023-07-27 ENCOUNTER — Other Ambulatory Visit: Payer: Self-pay

## 2023-07-27 VITALS — BP 147/104 | HR 86 | Ht 69.0 in | Wt 193.6 lb

## 2023-07-27 DIAGNOSIS — M810 Age-related osteoporosis without current pathological fracture: Secondary | ICD-10-CM

## 2023-07-27 DIAGNOSIS — I10 Essential (primary) hypertension: Secondary | ICD-10-CM | POA: Diagnosis not present

## 2023-07-27 DIAGNOSIS — G479 Sleep disorder, unspecified: Secondary | ICD-10-CM | POA: Diagnosis not present

## 2023-07-27 DIAGNOSIS — H6123 Impacted cerumen, bilateral: Secondary | ICD-10-CM | POA: Diagnosis not present

## 2023-07-27 DIAGNOSIS — M150 Primary generalized (osteo)arthritis: Secondary | ICD-10-CM

## 2023-07-27 MED ORDER — AMITRIPTYLINE HCL 50 MG PO TABS: ORAL_TABLET | ORAL | 0 refills | Status: DC

## 2023-07-27 MED ORDER — DICLOFENAC SODIUM 1 % EX GEL: 4.0000 g | Freq: Four times a day (QID) | CUTANEOUS | 1 refills | Status: DC

## 2023-07-27 MED ORDER — DEBROX 6.5 % OT SOLN: 5.0000 [drp] | Freq: Two times a day (BID) | OTIC | 0 refills | Status: AC

## 2023-07-27 MED ORDER — AMLODIPINE BESYLATE 10 MG PO TABS: 10.0000 mg | ORAL_TABLET | Freq: Every day | ORAL | 3 refills | Status: DC

## 2023-07-27 NOTE — Patient Instructions (Addendum)
It was wonderful to see you today.  Please bring ALL of your medications with you to every visit.   Today we talked about:  Knee pain - I have sent a physical therapy referral. Use the Voltaren gel regularly.   Hypertension - Keep taking your lisinopril   Ears - Use the debrox drops as needed. Try to run water in your ear and then dry with a tissue every once in a while.   Thank you for choosing [facility].   Please call [phone] with any questions about today's appointment.  Please be sure to schedule follow up at the front desk before you leave today.   Lockie Mola, MD  Family Medicine

## 2023-07-27 NOTE — Progress Notes (Signed)
    SUBJECTIVE:   CHIEF COMPLAINT / HPI:   L Knee Pain  Patient reports left knee pain that has been chronic. Says that it will have flares of pain. Has tried steroid injections but says they do not last long. Patient is still able to walk around with minimal pain. He is a Naval architect with 8 hour drives. Pain is worse after long drive.   Hypertension  Patient states he has been taking his daily medication. Denies any side effects.   PERTINENT  PMH / PSH: Osteoarthritis, HTN   OBJECTIVE:   BP (!) 138/97   Pulse 95   Ht 5\' 9"  (1.753 m)   Wt 193 lb 9.6 oz (87.8 kg)   SpO2 100%   BMI 28.59 kg/m   General: well appearing, in no acute distress CV: RRR, radial pulses equal and palpable, no BLE edema  Resp: Normal work of breathing on room air, CTAB MSK: No erythema or warmth over the knee joints, mild edema suprapattelar on L side.  Neuro: Alert & Oriented x 4  Psyc: very pleasant    ASSESSMENT/PLAN:   Assessment & Plan Primary osteoarthritis involving multiple joints Patient has knee pain secondary to osteoarthritis though not severe.  - Recommended lifestyle modifications  - Would benefit from physical therapy as patient has not tried this before  - Voltaren gel  Essential hypertension Uncontrolled at this time with current medication  - Will increase to dual agent therapy with continuation of lisinopril 20 mg and starting amlodipine 10 mg  - Follow up in 2-4 weeks for BP check       Lockie Mola, MD [facility]

## 2023-07-29 MED ORDER — DICLOFENAC SODIUM 1 % EX GEL: 4.0000 g | Freq: Four times a day (QID) | CUTANEOUS | 1 refills | Status: DC

## 2023-07-29 NOTE — Assessment & Plan Note (Signed)
Uncontrolled at this time with current medication  - Will increase to dual agent therapy with continuation of lisinopril 20 mg and starting amlodipine 10 mg  - Follow up in 2-4 weeks for BP check

## 2023-08-02 DIAGNOSIS — H5213 Myopia, bilateral: Secondary | ICD-10-CM | POA: Diagnosis not present

## 2023-08-02 DIAGNOSIS — H52223 Regular astigmatism, bilateral: Secondary | ICD-10-CM | POA: Diagnosis not present

## 2023-08-03 ENCOUNTER — Ambulatory Visit: Payer: Self-pay | Admitting: Family Medicine

## 2023-08-20 NOTE — Therapy (Signed)
OUTPATIENT PHYSICAL THERAPY LOWER EXTREMITY EVALUATION   Patient Name: Gerald Parker MRN: [id number] DOB:Oct 31, 1970, 52 y.o., male Today's Date: 08/24/2023  END OF SESSION:  PT End of Session - 08/24/23 1416     Visit Number 1    Number of Visits 12    Date for PT Re-Evaluation 10/23/22    Authorization Type MCD    PT Start Time 1345    PT Stop Time 1430    PT Time Calculation (min) 45 min    Activity Tolerance Patient tolerated treatment well    Behavior During Therapy [facility] for tasks assessed/performed             Past Medical History:  Diagnosis Date   ALLERGIC RHINITIS 09/27/2007   Qualifier: Diagnosis of  By: Daphine Deutscher FNP, [facility]     DENTAL CARIES 10/31/2007   Qualifier: Diagnosis of  By: Daphine Deutscher FNP, [facility]     GERD 09/27/2007   Qualifier: Diagnosis of  By: Daphine Deutscher FNP, [facility]     GERD (gastroesophageal reflux disease)    Hydrocele, right 06/28/2012   Hypertension    Numbness and tingling of foot 10/24/2013   Patient has a chronic history of this problem. Has been screened for diabetes in past. Symptoms seem to be related to patient's work.    Scrotal pain 12/19/2013   Patient has an identical presentation two years ago with a history of hydrocele. Previously was followed by [facility]. Differential includes hydrocele vs spermatocele vs epididymitis vs orchitis. Most likely hydrocele from physical exam and history, although could not illicit transillumination of the scrotum. Wanted GC/chlamydia but it was not obtained. Will have patient get ultrasound and    Sexual dysfunction 04/16/2016   History reviewed. No pertinent surgical history. 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  Symptom are diffuse in nature, do not radiate.  Patient is a poor historian regarding aggravating or relieving factors.  PERTINENT HISTORY: L Knee Pain  Patient reports left knee pain that has been chronic. Says that it will have flares of pain. Has tried steroid injections but says they do not last long. Patient is still able to walk around with minimal pain. He is a Naval architect with 8 hour drives. Pain is worse after long drive.  PAIN:  Are you having pain? Yes: NPRS scale: 8/10 Pain location: B knees Pain description: ache Aggravating factors: prolonged sitting, stairs Relieving factors: undetermined  PRECAUTIONS: None  RED FLAGS: None   WEIGHT BEARING RESTRICTIONS: No  FALLS:  Has patient fallen in last 6 months? No  OCCUPATION: truck driver  PLOF: Independent  PATIENT GOALS: To manage my knee symptoms  NEXT MD VISIT: TBD  OBJECTIVE:  Note: Objective measures were completed at Evaluation unless otherwise noted.  DIAGNOSTIC FINDINGS:  none  PATIENT SURVEYS:  FOTO 42(54 predicted)  MUSCLE LENGTH: WFL  POSTURE: No Significant postural limitations  PALPATION: TTP B lateral PF joints   LOWER EXTREMITY ROM: WNL  Active ROM Right eval Left eval  Hip flexion    Hip extension    Hip abduction    Hip adduction    Hip internal rotation    Hip external rotation    Knee flexion    Knee extension    Ankle dorsiflexion    Ankle  plantarflexion    Ankle inversion    Ankle eversion     (Blank rows = not tested)  LOWER EXTREMITY MMT:  MMT Right eval Left eval  Hip flexion    Hip extension    Hip abduction    Hip adduction    Hip internal rotation    Hip external rotation    Knee flexion    Knee extension 4- 4-  Ankle dorsiflexion    Ankle plantarflexion    Ankle inversion    Ankle eversion     (Blank rows = not tested)  LOWER EXTREMITY SPECIAL TESTS:  Knee special tests: Lateral pull sign: positive , Patellafemoral grind test: positive , and tight L ITB  FUNCTIONAL TESTS:  30s chair stand test 10 reps   GAIT: Distance walked: 70ft x2 Assistive device utilized: None Level of assistance: Complete Independence Comments: unremarkable   TODAY'S TREATMENT:                                                                                                                              DATE: 08/24/23 Eval   PATIENT EDUCATION:  Education details: Discussed eval findings, rehab rationale and POC and patient is in agreement  Person educated: Patient Education method: Explanation Education comprehension: verbalized understanding and needs further education  HOME EXERCISE PROGRAM: Access Code: [email or web address] URL: [email or web address] Date: 08/24/2023 Prepared by: Gustavus Bryant  Exercises - Sidelying Hip Abduction  - 1 x daily - 5 x weekly - 3 sets - 10 reps - Small Range Straight Leg Raise  - 1 x daily - 5 x weekly - 3 sets - 10 reps - Standing Heel Raise with Support  - 1 x daily - 5 x weekly - 3 sets - 10 reps  ASSESSMENT:  CLINICAL IMPRESSION: Patient is a 52 y.o. male who was seen today for physical therapy evaluation and treatment for chronic B anterior knee pain L>R.  Aggravating/reliving factors undetermined.  Symptoms confined to anterior aspects of knee and retropatellar region.   30s chair stand test demonstrates weakness in BLEs.  Positive signs of lateral patellar tracking,  VMO/VL inefficiency and tight L ITB contributing to symptoms.  OBJECTIVE IMPAIRMENTS: decreased knowledge of condition, decreased mobility, decreased strength, and pain.   ACTIVITY LIMITATIONS: carrying, squatting, and stairs  PERSONAL FACTORS: Fitness and Time since onset of injury/illness/exacerbation are also affecting patient's functional outcome.   REHAB POTENTIAL: Good  CLINICAL DECISION MAKING: Stable/uncomplicated  EVALUATION COMPLEXITY: Low   GOALS: Goals reviewed with patient? No  SHORT TERM GOALS: Target date: 09/14/2023   Patient to demonstrate independence in HEP  Baseline: [email or web address] Goal status: INITIAL  2.  Decrease L ITB irritation  Baseline: Tight ITB L  Goal status: INITIAL  3  LONG TERM GOALS: Target date: 10/05/2023    Patient will score at least 54% on FOTO to signify clinically meaningful improvement in functional abilities.   Baseline: 42 Goal status: INITIAL  2.  Patient will increase 30s chair stand reps from 10 to 15 with/without arms to demonstrate and improved functional ability with less pain/difficulty as well as reduce fall risk.  Baseline: 10 Goal status: INITIAL  3.  Decrease worst pain to 4/10 Baseline: 8/10 Goal status: INITIAL  4.  Improve VMO/VL contraction ratio  Baseline: + lateral patellar pull Goal status: INITIAL  5.  Increase B knee extension to 4/5 Baseline: 4-/5 Goal status: INITIAL   PLAN:  PT FREQUENCY: 1-2x/week  PT DURATION: 6 weeks  PLANNED INTERVENTIONS: 97164- PT Re-evaluation, 97110-Therapeutic exercises, 97530- Therapeutic activity, 97112- Neuromuscular re-education, 97535- Self Care, 16109- Manual therapy, 97116- Gait training, 97014- Electrical stimulation (unattended), Patient/Family education, Stair training, Dry Needling, Joint mobilization, DME instructions, Cryotherapy, and Moist heat  PLAN FOR NEXT SESSION: HEP review and update, manual techniques as appropriate, aerobic tasks, ROM and  flexibility activities, strengthening and PREs, TPDN, gait and balance training as needed    For all possible CPT codes, reference the Planned Interventions line above.     Check all conditions that are expected to impact treatment: {Conditions expected to impact treatment:None of these apply   If treatment provided at initial evaluation, no treatment charged due to lack of authorization.      Hildred Laser, PT 08/24/2023, 2:20 PM

## 2023-08-24 ENCOUNTER — Ambulatory Visit: Payer: Medicaid Other | Admitting: Student

## 2023-08-24 ENCOUNTER — Ambulatory Visit: Payer: Medicaid Other

## 2023-08-24 ENCOUNTER — Other Ambulatory Visit: Payer: Self-pay

## 2023-08-24 ENCOUNTER — Ambulatory Visit (INDEPENDENT_AMBULATORY_CARE_PROVIDER_SITE_OTHER): Payer: Medicaid Other | Admitting: Student

## 2023-08-24 ENCOUNTER — Ambulatory Visit: Payer: Medicaid Other | Attending: Family Medicine

## 2023-08-24 VITALS — BP 130/90 | HR 82 | Ht 69.0 in | Wt 191.8 lb

## 2023-08-24 DIAGNOSIS — M222X1 Patellofemoral disorders, right knee: Secondary | ICD-10-CM

## 2023-08-24 DIAGNOSIS — M25562 Pain in left knee: Secondary | ICD-10-CM | POA: Diagnosis not present

## 2023-08-24 DIAGNOSIS — M222X2 Patellofemoral disorders, left knee: Secondary | ICD-10-CM | POA: Insufficient documentation

## 2023-08-24 DIAGNOSIS — I10 Essential (primary) hypertension: Secondary | ICD-10-CM

## 2023-08-24 DIAGNOSIS — M6281 Muscle weakness (generalized): Secondary | ICD-10-CM

## 2023-08-24 DIAGNOSIS — G8929 Other chronic pain: Secondary | ICD-10-CM

## 2023-08-24 DIAGNOSIS — M150 Primary generalized (osteo)arthritis: Secondary | ICD-10-CM | POA: Insufficient documentation

## 2023-08-24 NOTE — Patient Instructions (Addendum)
It was great to see you today! Thank you for choosing [facility] for your primary care.  Today we addressed: We will increase Lisinopril   Blood Pressure Record Sheet To take your blood pressure, you will need a blood pressure machine. You can buy a blood pressure machine (blood pressure monitor) at your clinic, drug store, or online. When choosing one, consider: An automatic monitor that has an arm cuff. A cuff that wraps snugly around your upper arm. You should be able to fit only one finger between your arm and the cuff. A device that stores blood pressure reading results. Do not choose a monitor that measures your blood pressure from your wrist or finger. Follow your health care provider's instructions for how to take your blood pressure. To use this form: Take your blood pressure medications every day These measurements should be taken when you have been at rest for at least 10-15 min Take at least 2 readings with each blood pressure check. This makes sure the results are correct. Wait 1-2 minutes between measurements. Write down the results in the spaces on this form. Keep in mind it should always be recorded systolic over diastolic. Both numbers are important.  Repeat this every day for 2-3 weeks, or as told by your health care provider.  Make a follow-up appointment with your health care provider to discuss the results.  Blood Pressure Log Date Medications taken? (Y/N) Blood Pressure Time of Day                                                                                                         If you haven't already, sign up for My Chart to have easy access to your labs results, and communication with your primary care physician. I recommend that you always bring your medications to each appointment as this makes it easy to ensure you are on the correct medications and helps [place] not miss refills when you need them. Call the clinic at [phone]  if your symptoms worsen or you have any concerns.  Please arrive 15 minutes before your appointment to ensure smooth check in process.  We appreciate your efforts in making this happen.  Thank you for allowing me to participate in your care, Alfredo Martinez, MD 08/24/2023, 3:23 PM PGY-3, [facility]

## 2023-08-24 NOTE — Progress Notes (Unsigned)
    SUBJECTIVE:   CHIEF COMPLAINT / HPI:   Hypertension: BP: (!) 134/98 today. Home medications include: Lisinopril 20 mg and Amlodipine 10 mg. He {Blank single:19197::"endorses","does not endorse"} taking these medications as prescribed. {Blank single:19197::"Does","Does not"} check blood pressure at home.*** Diet ***. Exercise ***. Most recent creatinine trend:  Lab Results  Component Value Date   CREATININE 1.00 01/13/2022   CREATININE 0.96 12/29/2021   CREATININE 0.90 12/23/2021   Patient {HAS HAS OZH:08657} had a BMP in the past 1 year.   PERTINENT  PMH / PSH: ***  OBJECTIVE:   BP (!) 134/98   Pulse 82   Ht 5\' 9"  (1.753 m)   Wt 191 lb 12.8 oz (87 kg)   SpO2 98%   BMI 28.32 kg/m   General: Alert and oriented in no apparent distress Heart: Regular rate and rhythm with no murmurs appreciated Lungs: CTA bilaterally, no wheezing Abdomen: Bowel sounds present, no abdominal pain Skin: Warm and dry Extremities: No lower extremity edema   ASSESSMENT/PLAN:   No problem-specific Assessment & Plan notes found for this encounter.     Gerald Martinez, MD [facility]

## 2023-08-24 NOTE — Progress Notes (Deleted)
  SUBJECTIVE:   CHIEF COMPLAINT / HPI:   HTN Meds: Amlodipine 10 mg, lisinopril 20 mg BP Readings from Last 3 Encounters:  07/27/23 (!) 147/104  02/27/23 122/86  01/13/22 (!) 137/96    PERTINENT  PMH / PSH: ***  Past Medical History:  Diagnosis Date   ALLERGIC RHINITIS 09/27/2007   Qualifier: Diagnosis of  By: Daphine Deutscher FNP, [facility]     DENTAL CARIES 10/31/2007   Qualifier: Diagnosis of  By: Daphine Deutscher FNP, [facility]     GERD 09/27/2007   Qualifier: Diagnosis of  By: Daphine Deutscher FNP, [facility]     GERD (gastroesophageal reflux disease)    Hydrocele, right 06/28/2012   Hypertension    Numbness and tingling of foot 10/24/2013   Patient has a chronic history of this problem. Has been screened for diabetes in past. Symptoms seem to be related to patient's work.    Scrotal pain 12/19/2013   Patient has an identical presentation two years ago with a history of hydrocele. Previously was followed by [facility]. Differential includes hydrocele vs spermatocele vs epididymitis vs orchitis. Most likely hydrocele from physical exam and history, although could not illicit transillumination of the scrotum. Wanted GC/chlamydia but it was not obtained. Will have patient get ultrasound and    Sexual dysfunction 04/16/2016   OBJECTIVE:  There were no vitals taken for this visit. Physical Exam   ASSESSMENT/PLAN:   Assessment & Plan  No follow-ups on file. Bess Kinds, MD 08/24/2023, 7:50 AM PGY-***, [facility] {    This will disappear when note is signed, click to select method of visit    :1}

## 2023-08-25 LAB — BASIC METABOLIC PANEL
BUN/Creatinine Ratio: 10 (ref 9–20)
BUN: 10 mg/dL (ref 6–24)
CO2: 22 mmol/L (ref 20–29)
Calcium: 9.6 mg/dL (ref 8.7–10.2)
Chloride: 103 mmol/L (ref 96–106)
Creatinine, Ser: 1 mg/dL (ref 0.76–1.27)
Glucose: 99 mg/dL (ref 70–99)
Potassium: 4.4 mmol/L (ref 3.5–5.2)
Sodium: 139 mmol/L (ref 134–144)
eGFR: 90 mL/min/{1.73_m2} (ref >59)

## 2023-08-25 MED ORDER — LISINOPRIL 20 MG PO TABS: 40.0000 mg | ORAL_TABLET | Freq: Every day | ORAL | 0 refills | Status: DC

## 2023-08-25 NOTE — Assessment & Plan Note (Addendum)
Patient would like to stay on lisinopril after discussion about dual medications.  Would recommend a dual medication possibly with HCTZ, but patient would prefer to increase lisinopril to 40 mg to treat his hypertension.  Instructed to check blood pressures at home.  Additionally, obtained BMP today for updated lab.  Also ordered a future BMP to be obtained in 3 to 4 weeks after increasing lisinopril.  Return in 1 month. Amlodipine taken for destruction

## 2023-08-27 ENCOUNTER — Encounter: Payer: Self-pay | Admitting: Student

## 2023-09-04 NOTE — Therapy (Addendum)
OUTPATIENT PHYSICAL THERAPY TREATMENT NOTE/DISCHARGE   Patient Name: Gerald Parker MRN: [id number] DOB:Oct 12, 1971, 52 y.o., male Today's Date: 09/07/2023  END OF SESSION:  PT End of Session - 09/07/23 0917     Visit Number 2    Number of Visits 12    Date for PT Re-Evaluation 10/23/22    Authorization Type MCD    PT Start Time 0915    PT Stop Time 1000    PT Time Calculation (min) 45 min    Activity Tolerance Patient tolerated treatment well    Behavior During Therapy [facility] for tasks assessed/performed              Past Medical History:  Diagnosis Date   ALLERGIC RHINITIS 09/27/2007   Qualifier: Diagnosis of  By: Daphine Deutscher FNP, [facility]     DENTAL CARIES 10/31/2007   Qualifier: Diagnosis of  By: Daphine Deutscher FNP, [facility]     GERD 09/27/2007   Qualifier: Diagnosis of  By: Daphine Deutscher FNP, [facility]     GERD (gastroesophageal reflux disease)    Hydrocele, right 06/28/2012   Hypertension    Numbness and tingling of foot 10/24/2013   Patient has a chronic history of this problem. Has been screened for diabetes in past. Symptoms seem to be related to patient's work.    Scrotal pain 12/19/2013   Patient has an identical presentation two years ago with a history of hydrocele. Previously was followed by [facility]. Differential includes hydrocele vs spermatocele vs epididymitis vs orchitis. Most likely hydrocele from physical exam and history, although could not illicit transillumination of the scrotum. Wanted GC/chlamydia but it was not obtained. Will have patient get ultrasound and    Sexual dysfunction 04/16/2016   History reviewed. No pertinent surgical history. 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  PERTINENT HISTORY: L Knee Pain  Patient reports left knee pain that has been chronic. Says that it will have flares of pain. Has tried steroid injections but says they do not last long. Patient is still able to walk around with minimal pain. He is a Naval architect with 8 hour drives. Pain is worse after long drive.  PAIN:  Are you having pain? Yes: NPRS scale: 8/10 Pain location: B knees Pain description: ache Aggravating factors: prolonged sitting, stairs Relieving factors: undetermined  PRECAUTIONS: None  RED FLAGS: None   WEIGHT BEARING RESTRICTIONS: No  FALLS:  Has patient fallen in last 6 months? No  OCCUPATION: truck driver  PLOF: Independent  PATIENT GOALS: To manage my knee symptoms  NEXT MD VISIT: TBD  OBJECTIVE:  Note: Objective measures were completed at Evaluation unless otherwise noted.  DIAGNOSTIC FINDINGS: none  PATIENT SURVEYS:  FOTO 42(54 predicted)  MUSCLE LENGTH: WFL  POSTURE: No Significant postural limitations  PALPATION: TTP B lateral PF joints   LOWER EXTREMITY ROM: WNL  Active ROM Right eval Left eval  Hip flexion    Hip extension    Hip abduction    Hip adduction    Hip internal rotation    Hip external rotation    Knee flexion    Knee extension    Ankle dorsiflexion    Ankle plantarflexion    Ankle inversion    Ankle  eversion     (Blank rows = not tested)  LOWER EXTREMITY MMT:  MMT Right eval Left eval  Hip flexion    Hip extension    Hip abduction    Hip adduction    Hip internal rotation    Hip external rotation    Knee flexion    Knee extension 4- 4-  Ankle dorsiflexion    Ankle plantarflexion    Ankle inversion    Ankle eversion     (Blank rows = not tested)  LOWER EXTREMITY SPECIAL TESTS:  Knee special tests: Lateral pull sign: positive , Patellafemoral grind test: positive , and tight L ITB  FUNCTIONAL TESTS:  30s chair stand test 10 reps   GAIT: Distance walked: 42ft x2 Assistive device utilized: None Level of assistance: Complete Independence Comments: unremarkable   TODAY'S TREATMENT:            [facility] Adult PT Treatment:                                                DATE: 09/07/23 Therapeutic Exercise: Nustep L2 8 min Seated hamstring stretch L 30s x2 FAQ w/adduction 15x SAQ 2# 15x QS 3s hold 15x ITB stretch 30s x2 SLR 2# 15x TKE GTB 15x Bridge with ball 2s 15x Supine hip fallouts GTB 15x B, 15/15 unilaterally                                                                                                                  DATE: 08/24/23 Eval   PATIENT EDUCATION:  Education details: Discussed eval findings, rehab rationale and POC and patient is in agreement  Person educated: Patient Education method: Explanation Education comprehension: verbalized understanding and needs further education  HOME EXERCISE PROGRAM: Access Code: [email or web address] URL: [email or web address] Date: 08/24/2023 Prepared by: Gustavus Bryant  Exercises - Sidelying Hip Abduction  - 1 x daily - 5 x weekly - 3 sets - 10 reps - Small Range Straight Leg Raise  - 1 x daily - 5 x weekly - 3 sets - 10 reps - Standing Heel Raise with Support  - 1 x daily - 5 x weekly - 3 sets - 10 reps  ASSESSMENT:  CLINICAL IMPRESSION: Patient returns for first f/u session.  Treatment consisted of  aerobic tasks and exercises to incorporated VMO facilitation, TKE ITB stretch and hip strengthening.  PF crepitus evident with today's activities.  May consider taping.   Patient is a 52 y.o. male who was seen today for physical therapy evaluation and treatment for chronic B anterior knee pain L>R.  Aggravating/reliving factors undetermined.  Symptoms confined to anterior aspects of knee and retropatellar region.   30s chair stand test demonstrates weakness in BLEs.  Positive signs of lateral patellar tracking, VMO/VL inefficiency and tight L ITB contributing to symptoms.  OBJECTIVE IMPAIRMENTS: decreased knowledge of condition, decreased mobility, decreased strength, and pain.   ACTIVITY LIMITATIONS: carrying, squatting, and stairs  PERSONAL FACTORS: Fitness and Time since onset of injury/illness/exacerbation are also affecting patient's functional outcome.   REHAB POTENTIAL: Good  CLINICAL DECISION MAKING: Stable/uncomplicated  EVALUATION COMPLEXITY: Low   GOALS: Goals reviewed with patient? No  SHORT TERM GOALS: Target date: 09/14/2023   Patient to demonstrate independence in HEP  Baseline: [id number] Goal status: INITIAL  2.  Decrease L ITB irritation  Baseline: Tight ITB L  Goal status: INITIAL   LONG TERM GOALS: Target date: 10/05/2023    Patient will score at least 54% on FOTO to signify clinically meaningful improvement in functional abilities.   Baseline: 42 Goal status: INITIAL  2.  Patient will increase 30s chair stand reps from 10 to 15 with/without arms to demonstrate and improved functional ability with less pain/difficulty as well as reduce fall risk.  Baseline: 10 Goal status: INITIAL  3.  Decrease worst pain to 4/10 Baseline: 8/10 Goal status: INITIAL  4.  Improve VMO/VL contraction ratio  Baseline: + lateral patellar pull Goal status: INITIAL  5.  Increase B knee extension to 4/5 Baseline: 4-/5 Goal status: INITIAL   PLAN:  PT FREQUENCY:  1-2x/week  PT DURATION: 6 weeks  PLANNED INTERVENTIONS: 97164- PT Re-evaluation, 97110-Therapeutic exercises, 97530- Therapeutic activity, 97112- Neuromuscular re-education, 97535- Self Care, 16109- Manual therapy, 97116- Gait training, 97014- Electrical stimulation (unattended), Patient/Family education, Stair training, Dry Needling, Joint mobilization, DME instructions, Cryotherapy, and Moist heat  PLAN FOR NEXT SESSION: HEP review and update, manual techniques as appropriate, aerobic tasks, ROM and flexibility activities, strengthening and PREs, TPDN, gait and balance training as needed    For all possible CPT codes, reference the Planned Interventions line above.     Check all conditions that are expected to impact treatment: {Conditions expected to impact treatment:None of these apply   If treatment provided at initial evaluation, no treatment charged due to lack of authorization.      Hildred Laser, PT 09/07/2023, 9:55 AM

## 2023-09-07 ENCOUNTER — Ambulatory Visit: Payer: Medicaid Other

## 2023-09-07 DIAGNOSIS — M25562 Pain in left knee: Secondary | ICD-10-CM | POA: Diagnosis not present

## 2023-09-07 DIAGNOSIS — M222X2 Patellofemoral disorders, left knee: Secondary | ICD-10-CM | POA: Diagnosis not present

## 2023-09-07 DIAGNOSIS — M6281 Muscle weakness (generalized): Secondary | ICD-10-CM

## 2023-09-07 DIAGNOSIS — M222X1 Patellofemoral disorders, right knee: Secondary | ICD-10-CM

## 2023-09-07 DIAGNOSIS — G8929 Other chronic pain: Secondary | ICD-10-CM

## 2023-09-07 DIAGNOSIS — M150 Primary generalized (osteo)arthritis: Secondary | ICD-10-CM | POA: Diagnosis not present

## 2023-09-19 NOTE — Therapy (Unsigned)
OUTPATIENT PHYSICAL THERAPY TREATMENT NOTE   Patient Name: Gerald Parker MRN: [id number] DOB:[birth year], 52 y.o., male Today's Date: 09/19/2023  END OF SESSION:     Past Medical History:  Diagnosis Date   ALLERGIC RHINITIS 09/27/2007   Qualifier: Diagnosis of  By: Daphine Deutscher FNP, [facility]     DENTAL CARIES 10/31/2007   Qualifier: Diagnosis of  By: Daphine Deutscher FNP, [facility]     GERD 09/27/2007   Qualifier: Diagnosis of  By: Daphine Deutscher FNP, [facility]     GERD (gastroesophageal reflux disease)    Hydrocele, right 06/28/2012   Hypertension    Numbness and tingling of foot 10/24/2013   Patient has a chronic history of this problem. Has been screened for diabetes in past. Symptoms seem to be related to patient's work.    Scrotal pain 12/19/2013   Patient has an identical presentation two years ago with a history of hydrocele. Previously was followed by [facility]. Differential includes hydrocele vs spermatocele vs epididymitis vs orchitis. Most likely hydrocele from physical exam and history, although could not illicit transillumination of the scrotum. Wanted GC/chlamydia but it was not obtained. Will have patient get ultrasound and    Sexual dysfunction 04/16/2016   No past surgical history on file. 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  Rationale for Evaluation and Treatment:  Rehabilitation  ONSET DATE: chronic  SUBJECTIVE:   SUBJECTIVE STATEMENT: Has been compliant with HEP, exercises replicate anterior knee pain.  PERTINENT HISTORY: L Knee Pain  Patient reports left knee pain that has been chronic. Says that it will have flares of pain. Has tried steroid injections but says they do not last long. Patient is still able to walk around with minimal pain. He is a Naval architect with 8 hour drives. Pain is worse after long drive.  PAIN:  Are you having pain? Yes: NPRS scale: 8/10 Pain location: B knees Pain description: ache Aggravating factors: prolonged sitting, stairs Relieving factors: undetermined  PRECAUTIONS: None  RED FLAGS: None   WEIGHT BEARING RESTRICTIONS: No  FALLS:  Has patient fallen in last 6 months? No  OCCUPATION: truck driver  PLOF: Independent  PATIENT GOALS: To manage my knee symptoms  NEXT MD VISIT: TBD  OBJECTIVE:  Note: Objective measures were completed at Evaluation unless otherwise noted.  DIAGNOSTIC FINDINGS: none  PATIENT SURVEYS:  FOTO 42(54 predicted)  MUSCLE LENGTH: WFL  POSTURE: No Significant postural limitations  PALPATION: TTP B lateral PF joints   LOWER EXTREMITY ROM: WNL  Active ROM Right eval Left eval  Hip flexion    Hip extension    Hip abduction    Hip adduction    Hip internal rotation    Hip external rotation    Knee flexion    Knee extension    Ankle dorsiflexion    Ankle plantarflexion    Ankle inversion    Ankle eversion     (Blank rows = not tested)  LOWER EXTREMITY MMT:  MMT Right eval Left eval  Hip flexion    Hip extension    Hip abduction    Hip adduction    Hip internal rotation    Hip external rotation    Knee flexion    Knee extension 4- 4-  Ankle dorsiflexion    Ankle plantarflexion    Ankle inversion    Ankle eversion     (Blank rows = not tested)  LOWER EXTREMITY SPECIAL TESTS:  Knee special tests: Lateral pull sign: positive , Patellafemoral  grind test: positive , and tight L ITB  FUNCTIONAL TESTS:  30s chair stand test 10 reps   GAIT: Distance walked: 44ft x2 Assistive device utilized: None Level of assistance: Complete Independence Comments: unremarkable   TODAY'S TREATMENT:            [facility] Adult PT Treatment:                                                DATE: 09/07/23 Therapeutic Exercise: Nustep L2 8 min Seated hamstring stretch L 30s x2 FAQ w/adduction 15x SAQ 2# 15x QS 3s hold 15x ITB stretch 30s x2 SLR 2# 15x TKE GTB 15x Bridge with ball 2s 15x Supine hip fallouts GTB 15x B, 15/15 unilaterally                                                                                                                  DATE: 08/24/23 Eval   PATIENT EDUCATION:  Education details: Discussed eval findings, rehab rationale and POC and patient is in agreement  Person educated: Patient Education method: Explanation Education comprehension: verbalized understanding and needs further education  HOME EXERCISE PROGRAM: Access Code: [email or web address] URL: [email or web address] Date: 08/24/2023 Prepared by: Gustavus Bryant  Exercises - Sidelying Hip Abduction  - 1 x daily - 5 x weekly - 3 sets - 10 reps - Small Range Straight Leg Raise  - 1 x daily - 5 x weekly - 3 sets - 10 reps - Standing Heel Raise with Support  - 1 x daily - 5 x weekly - 3 sets - 10 reps  ASSESSMENT:  CLINICAL IMPRESSION: Patient returns for first f/u session.  Treatment consisted of aerobic tasks and exercises to incorporated VMO facilitation, TKE ITB stretch and hip strengthening.  PF crepitus evident with today's activities.  May consider taping.   Patient is a 52 y.o. male who was seen today for physical therapy evaluation and treatment for chronic B anterior knee pain L>R.  Aggravating/reliving factors undetermined.  Symptoms confined to anterior aspects of knee and retropatellar region.   30s chair stand test demonstrates weakness in BLEs.   Positive signs of lateral patellar tracking, VMO/VL inefficiency and tight L ITB contributing to symptoms.  OBJECTIVE IMPAIRMENTS: decreased knowledge of condition, decreased mobility, decreased strength, and pain.   ACTIVITY LIMITATIONS: carrying, squatting, and stairs  PERSONAL FACTORS: Fitness and Time since onset of injury/illness/exacerbation are also affecting patient's functional outcome.   REHAB POTENTIAL: Good  CLINICAL DECISION MAKING: Stable/uncomplicated  EVALUATION COMPLEXITY: Low   GOALS: Goals reviewed with patient? No  SHORT TERM GOALS: Target date: 09/14/2023   Patient to demonstrate independence in HEP  Baseline: [id number] Goal status: INITIAL  2.  Decrease L ITB irritation  Baseline: Tight ITB L  Goal status: INITIAL   LONG TERM GOALS: Target date: 10/05/2023    Patient will score at least 54% on FOTO to signify clinically meaningful improvement in functional abilities.   Baseline: 42 Goal status: INITIAL  2.  Patient will increase 30s chair stand reps from 10 to 15 with/without arms to demonstrate and improved functional ability with less pain/difficulty as well as reduce fall risk.  Baseline: 10 Goal status: INITIAL  3.  Decrease worst pain to 4/10 Baseline: 8/10 Goal status: INITIAL  4.  Improve VMO/VL contraction ratio  Baseline: + lateral patellar pull Goal status: INITIAL  5.  Increase B knee extension to 4/5 Baseline: 4-/5 Goal status: INITIAL   PLAN:  PT FREQUENCY: 1-2x/week  PT DURATION: 6 weeks  PLANNED INTERVENTIONS: 97164- PT Re-evaluation, 97110-Therapeutic exercises, 97530- Therapeutic activity, 97112- Neuromuscular re-education, 97535- Self Care, 16109- Manual therapy, 97116- Gait training, 97014- Electrical stimulation (unattended), Patient/Family education, Stair training, Dry Needling, Joint mobilization, DME instructions, Cryotherapy, and Moist heat  PLAN FOR NEXT SESSION: HEP review and update, manual techniques as  appropriate, aerobic tasks, ROM and flexibility activities, strengthening and PREs, TPDN, gait and balance training as needed    For all possible CPT codes, reference the Planned Interventions line above.     Check all conditions that are expected to impact treatment: {Conditions expected to impact treatment:None of these apply   If treatment provided at initial evaluation, no treatment charged due to lack of authorization.      Hildred Laser, PT 09/19/2023, 1:14 PM

## 2023-09-21 ENCOUNTER — Ambulatory Visit: Payer: Medicaid Other

## 2023-10-02 ENCOUNTER — Other Ambulatory Visit: Payer: Self-pay | Admitting: Family Medicine

## 2023-10-02 DIAGNOSIS — E7849 Other hyperlipidemia: Secondary | ICD-10-CM

## 2023-11-16 ENCOUNTER — Other Ambulatory Visit: Payer: Self-pay | Admitting: Family Medicine

## 2023-11-16 DIAGNOSIS — I10 Essential (primary) hypertension: Secondary | ICD-10-CM

## 2023-11-19 MED ORDER — LISINOPRIL 20 MG PO TABS: 40.0000 mg | ORAL_TABLET | Freq: Every day | ORAL | 0 refills | Status: DC

## 2023-11-19 NOTE — Telephone Encounter (Signed)
Short course refill of lisinopril 40 mg daily sent to preferred pharmacy.  Will need to follow-up with Dr. Marisue Humble in clinic to discuss BP further tomorrow.  Elberta Fortis, DO

## 2023-11-19 NOTE — Addendum Note (Signed)
Addended by: Elberta Fortis on: 11/19/2023 02:29 PM   Modules accepted: Orders

## 2023-11-19 NOTE — Addendum Note (Signed)
Addended by: Elberta Fortis on: 11/19/2023 02:28 PM   Modules accepted: Orders

## 2023-11-19 NOTE — Telephone Encounter (Signed)
Patient calls nurse line regarding BP medication. He reports that he is completely out of medication.   Scheduled patient with Dr. Marisue Humble tomorrow afternoon for BP follow up.   He is asking for partial refill until his appointment tomorrow.   Veronda Prude, RN

## 2023-11-20 ENCOUNTER — Encounter: Payer: Self-pay | Admitting: Student

## 2023-11-20 ENCOUNTER — Ambulatory Visit (INDEPENDENT_AMBULATORY_CARE_PROVIDER_SITE_OTHER): Payer: Medicaid Other | Admitting: Student

## 2023-11-20 VITALS — BP 128/100 | HR 90 | Ht 69.0 in | Wt 192.6 lb

## 2023-11-20 DIAGNOSIS — I10 Essential (primary) hypertension: Secondary | ICD-10-CM | POA: Diagnosis not present

## 2023-11-20 MED ORDER — OLMESARTAN MEDOXOMIL-HCTZ 20-12.5 MG PO TABS: 1.0000 | ORAL_TABLET | Freq: Every day | ORAL | 1 refills | Status: DC

## 2023-11-20 NOTE — Patient Instructions (Addendum)
 Mr. Gerald, Parker to meet you!  I want you to STOP your lisinopril .  START olmesartan -hydrochlorothiazide 20-12.5mg  daily.   Keep a BP log for the next two weeks and bring this with you when you see me on the 21st.   I am checking some bloodwork and will call you if we need to make any changes.   JINNY Con Gasman, MD

## 2023-11-20 NOTE — Progress Notes (Signed)
    SUBJECTIVE:   CHIEF COMPLAINT / HPI:   Hypertension Currently taking lisinopril  40 mg daily.  He was previously also on amlodipine  but this upset his stomach so he is just on lisinopril  monotherapy.  He does check his blood pressures at home and tells me that his systolics are generally in the 130s.  He does not pay attention to the diastolics.   OBJECTIVE:   BP (!) 128/100   Pulse 90   Ht 5' 9 (1.753 m)   Wt 192 lb 9.6 oz (87.4 kg)   SpO2 97%   BMI 28.44 kg/m   General: alert & oriented, no apparent distress, well groomed HEENT: normocephalic, atraumatic, EOM grossly intact, oral mucosa moist, neck supple. Cardio: RRR without murmur Respiratory: normal respiratory effort, lungs clear throughout GI: non-distended Skin: no rashes, no jaundice Ext: Without edema or deformity  Psych: appropriate mood and affect  ASSESSMENT/PLAN:   Essential hypertension Elevated diastolic pressures x 2 in the office today.  In the past he has been hesitant to be on combination therapy due to prior bad interactions with amlodipine .  However, given his young age and overall good health, I do think it is worthwhile pursuing better BP control.  Therefore we will transition him from lisinopril  40 mg daily to olmesartan -hydrochlorothiazide. -Stop lisinopril  40 mg daily -Start olmesartan -hydrochlorothiazide 20-12.5 mg daily -BMP today -Return in 2 weeks, he will keep a home BP log and bring it back with him     J Con Gasman, MD [facility]

## 2023-11-21 ENCOUNTER — Encounter: Payer: Self-pay | Admitting: Student

## 2023-11-21 ENCOUNTER — Telehealth: Payer: Self-pay | Admitting: Pharmacist

## 2023-11-21 DIAGNOSIS — I10 Essential (primary) hypertension: Secondary | ICD-10-CM

## 2023-11-21 LAB — BASIC METABOLIC PANEL
BUN/Creatinine Ratio: 12 (ref 9–20)
BUN: 13 mg/dL (ref 6–24)
CO2: 23 mmol/L (ref 20–29)
Calcium: 9.3 mg/dL (ref 8.7–10.2)
Chloride: 103 mmol/L (ref 96–106)
Creatinine, Ser: 1.1 mg/dL (ref 0.76–1.27)
Glucose: 97 mg/dL (ref 70–99)
Potassium: 4.5 mmol/L (ref 3.5–5.2)
Sodium: 140 mmol/L (ref 134–144)
eGFR: 80 mL/min/{1.73_m2} (ref >59)

## 2023-11-21 NOTE — Telephone Encounter (Signed)
 Reviewed and agree with Dr Macky Lower plan.

## 2023-11-21 NOTE — Telephone Encounter (Signed)
 During phone interaction with the pharmacy for another patient, the pharmacist asked if I could assist with prescription change for this patient.   Previous prescription from Dr. Theophilus lisinopril  20mg  take 2 tablets daily was requested to be changed to lisinopril  40mg  daily.   However, upon further review.  Patient was prescribed olmesartan /hydrochlorothiazide yesterday by Dr. Marlee - replacing lisinopril .   Verified new prescription was received - cancelled lisinopril .  Pharmacist thanked me for assistance.    Total time with patient call and documentation of interaction: 12 minutes.

## 2023-11-22 NOTE — Assessment & Plan Note (Signed)
 Elevated diastolic pressures x 2 in the office today.  In the past he has been hesitant to be on combination therapy due to prior bad interactions with amlodipine .  However, given his young age and overall good health, I do think it is worthwhile pursuing better BP control.  Therefore we will transition him from lisinopril  40 mg daily to olmesartan -hydrochlorothiazide. -Stop lisinopril  40 mg daily -Start olmesartan -hydrochlorothiazide 20-12.5 mg daily -BMP today -Return in 2 weeks, he will keep a home BP log and bring it back with him

## 2023-11-23 ENCOUNTER — Other Ambulatory Visit: Payer: Self-pay

## 2023-11-26 DIAGNOSIS — M940 Chondrocostal junction syndrome [Tietze]: Secondary | ICD-10-CM | POA: Diagnosis not present

## 2023-11-26 DIAGNOSIS — R0789 Other chest pain: Secondary | ICD-10-CM | POA: Diagnosis not present

## 2023-11-26 DIAGNOSIS — R9431 Abnormal electrocardiogram [ECG] [EKG]: Secondary | ICD-10-CM | POA: Diagnosis not present

## 2023-12-07 ENCOUNTER — Ambulatory Visit: Payer: Medicaid Other | Admitting: Student

## 2023-12-07 VITALS — BP 116/70 | HR 84 | Wt 192.0 lb

## 2023-12-07 DIAGNOSIS — E7849 Other hyperlipidemia: Secondary | ICD-10-CM

## 2023-12-07 DIAGNOSIS — I10 Essential (primary) hypertension: Secondary | ICD-10-CM | POA: Diagnosis not present

## 2023-12-07 DIAGNOSIS — Z1159 Encounter for screening for other viral diseases: Secondary | ICD-10-CM | POA: Diagnosis not present

## 2023-12-07 DIAGNOSIS — G479 Sleep disorder, unspecified: Secondary | ICD-10-CM

## 2023-12-07 MED ORDER — ROSUVASTATIN CALCIUM 10 MG PO TABS: 10.0000 mg | ORAL_TABLET | Freq: Every day | ORAL | 0 refills | Status: DC

## 2023-12-07 MED ORDER — AMITRIPTYLINE HCL 50 MG PO TABS: ORAL_TABLET | ORAL | 0 refills | Status: DC

## 2023-12-07 NOTE — Progress Notes (Signed)
     SUBJECTIVE:   CHIEF COMPLAINT / HPI:   BP Follow-up Seen by me 2 weeks ago and found to have elevated diastolic pressures x 2.  We therefore stopped his lisinopril 40 mg daily and started him on olmesartan-hydrochlorothiazide 20-12.5 mg daily.  He tells me that he has been tolerating these just fine.  He has been keeping a BP log at home but forgot to bring it in today.  Tells me that he is usually in the 120s or 130s over 80s.  He has only had 1 reading that was high that was 140/90.  Primary Insomnia Needs refill of Elavil. Tells me this works quite well for him and he has no other concerns, only that he has noticed significant decline in both sleep latency and maintenance since running out.    OBJECTIVE:   BP 116/70   Pulse 84   Wt 192 lb (87.1 kg)   BMI 28.35 kg/m   General: alert & oriented, no apparent distress, well groomed HEENT: normocephalic, atraumatic, EOM grossly intact, oral mucosa moist, neck supple Respiratory: normal respiratory effort GI: non-distended Skin: no rashes, no jaundice Psych: appropriate mood and affect   ASSESSMENT/PLAN:   Assessment & Plan Essential hypertension Excellent BP control today.  Pleased with his home readings as well. -Repeat BMP to ensure he is tolerating combo medicine fine, otherwise can space out follow-up from here Difficulty sleeping I have refilled his Elavil as requested.  It seems that this is working quite well for him.  Would not make any changes.     [name], MD [facility]

## 2023-12-07 NOTE — Patient Instructions (Signed)
Mr. Gerald Parker,  No changes today. We're in a good place. Let's just recheck your kidneys.  [name], MD

## 2023-12-08 LAB — BASIC METABOLIC PANEL
BUN/Creatinine Ratio: 11 (ref 9–20)
BUN: 12 mg/dL (ref 6–24)
CO2: 25 mmol/L (ref 20–29)
Calcium: 9.4 mg/dL (ref 8.7–10.2)
Chloride: 100 mmol/L (ref 96–106)
Creatinine, Ser: 1.05 mg/dL (ref 0.76–1.27)
Glucose: 109 mg/dL — ABNORMAL HIGH (ref 70–99)
Potassium: 4.1 mmol/L (ref 3.5–5.2)
Sodium: 142 mmol/L (ref 134–144)
eGFR: 84 mL/min/{1.73_m2} (ref >59)

## 2023-12-08 LAB — HCV INTERPRETATION

## 2023-12-08 LAB — HCV AB W REFLEX TO QUANT PCR: HCV Ab: NONREACTIVE

## 2023-12-09 NOTE — Assessment & Plan Note (Signed)
 I have refilled his Elavil as requested.  It seems that this is working quite well for him.  Would not make any changes.

## 2023-12-09 NOTE — Assessment & Plan Note (Signed)
 Excellent BP control today.  Pleased with his home readings as well. -Repeat BMP to ensure he is tolerating combo medicine fine, otherwise can space out follow-up from here

## 2023-12-13 ENCOUNTER — Encounter: Payer: Self-pay | Admitting: Student

## 2024-01-16 ENCOUNTER — Other Ambulatory Visit: Payer: Self-pay | Admitting: Student

## 2024-01-16 DIAGNOSIS — I10 Essential (primary) hypertension: Secondary | ICD-10-CM

## 2024-01-18 ENCOUNTER — Other Ambulatory Visit: Payer: Self-pay | Admitting: Family Medicine

## 2024-01-18 ENCOUNTER — Other Ambulatory Visit: Payer: Self-pay | Admitting: Student

## 2024-01-18 DIAGNOSIS — G479 Sleep disorder, unspecified: Secondary | ICD-10-CM

## 2024-01-18 DIAGNOSIS — E7849 Other hyperlipidemia: Secondary | ICD-10-CM

## 2024-02-21 ENCOUNTER — Other Ambulatory Visit: Payer: Self-pay | Admitting: Student

## 2024-02-21 DIAGNOSIS — G479 Sleep disorder, unspecified: Secondary | ICD-10-CM

## 2024-02-21 DIAGNOSIS — E7849 Other hyperlipidemia: Secondary | ICD-10-CM

## 2024-03-03 NOTE — Progress Notes (Signed)
    SUBJECTIVE:   CHIEF COMPLAINT / HPI:   Left flank pain Ongoing for the past 2 weeks. Works as a [employer], doing a lot of sitting and lifting. Unsure if injury occurred. Not doing anything for it. Denies dysuria or urinary frequency. Denies fever or N/V.  Foot rash Rash in between toes and both feet. Ongoing since the fall and not getting worse. Very itchy. Not painful. No other similar rash on his body. Denies fever.  Hypertension: - Medications: Olmesartan -HCTZ 20-12.5 mg daily - Compliance: Yes - Checking BP at home: No - Denies any SOB, CP, vision changes, LE edema, medication SEs, or symptoms of hypotension   PERTINENT  PMH / PSH: GERD, HTN  OBJECTIVE:   BP 125/88   Pulse 94   Ht 5\' 9"  (1.753 m)   Wt 184 lb 12.8 oz (83.8 kg)   SpO2 99%   BMI 27.29 kg/m    General: NAD, pleasant, able to participate in exam Cardiac: RRR, no murmurs. Respiratory: CTAB, normal effort, No wheezes, rales or rhonchi Abdomen: Bowel sounds present, nontender, nondistended. Soft. Neg CVA tenderness Extremities: no edema or cyanosis. Skin: Dry flaking skin in between all toes on both feet with white residue and some surrounding erythema. MSK: TTP over midaxillary surface of ribs 8-10 on the left side. No erythema or ecchymosis noted.   ASSESSMENT/PLAN:   Assessment & Plan Rib pain Most consistent with rib strain, no urinary symptoms and low concern for intraabdominal pathology. 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 Can use muscle relaxer prn at night. Provided with home stretches. -Voltaren  gel QID prn -Cyclobenzaprine  5mg  at bedtime prn Tinea pedis of both feet Present upon exam. Will initiate topical antifungal and discussed hygiene regimen. -Ketoconazole 2% daily x 1 months, can continue for 2 additional months if still present Essential hypertension 125/88, controlled on current regimen. Refill Olmesartan -hydrochlorothiazide  20-12.5mg  daily Difficulty sleeping Stable on Amitriptyline  10mg  nightly, refill today. Other hyperlipidemia Doing well on Rosuvastatin  10mg  daily, refill today. -Due for lipid panel at f/u       Dr. Jonne Netters, DO [facility]

## 2024-03-04 ENCOUNTER — Encounter: Payer: Self-pay | Admitting: Family Medicine

## 2024-03-04 ENCOUNTER — Ambulatory Visit (INDEPENDENT_AMBULATORY_CARE_PROVIDER_SITE_OTHER): Admitting: Family Medicine

## 2024-03-04 VITALS — BP 125/88 | HR 94 | Ht 69.0 in | Wt 184.8 lb

## 2024-03-04 DIAGNOSIS — I10 Essential (primary) hypertension: Secondary | ICD-10-CM

## 2024-03-04 DIAGNOSIS — G479 Sleep disorder, unspecified: Secondary | ICD-10-CM | POA: Diagnosis not present

## 2024-03-04 DIAGNOSIS — R0781 Pleurodynia: Secondary | ICD-10-CM | POA: Diagnosis not present

## 2024-03-04 DIAGNOSIS — E7849 Other hyperlipidemia: Secondary | ICD-10-CM

## 2024-03-04 DIAGNOSIS — B353 Tinea pedis: Secondary | ICD-10-CM | POA: Diagnosis not present

## 2024-03-04 MED ORDER — CYCLOBENZAPRINE HCL 5 MG PO TABS: 5.0000 mg | ORAL_TABLET | Freq: Every evening | ORAL | 1 refills | Status: DC | PRN

## 2024-03-04 MED ORDER — KETOCONAZOLE 2 % EX CREA: TOPICAL_CREAM | Freq: Every day | CUTANEOUS | 2 refills | Status: DC

## 2024-03-04 MED ORDER — OLMESARTAN MEDOXOMIL-HCTZ 20-12.5 MG PO TABS: 1.0000 | ORAL_TABLET | Freq: Every day | ORAL | 1 refills | Status: DC

## 2024-03-04 MED ORDER — DICLOFENAC SODIUM 1 % EX GEL: 4.0000 g | Freq: Four times a day (QID) | CUTANEOUS | 1 refills | Status: AC

## 2024-03-04 MED ORDER — ROSUVASTATIN CALCIUM 10 MG PO TABS: 10.0000 mg | ORAL_TABLET | Freq: Every day | ORAL | 1 refills | Status: DC

## 2024-03-04 MED ORDER — AMITRIPTYLINE HCL 50 MG PO TABS: ORAL_TABLET | ORAL | 1 refills | Status: DC

## 2024-03-04 NOTE — Patient Instructions (Addendum)
 It was wonderful to see you today! Thank you for choosing [facility].   Please bring ALL of your medications with you to every visit.   Today we talked about:  I think that you strained some of the ribs on your left side.  As we discussed you can use the topical Voltaren  gel while you are working during the day for pain relief.  I will also give you some stretches that you can do.  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 Your blood pressure looks great today, please continue to take the medication as prescribed. As we discussed, the foot fungus can be very difficult to get rid of and take an extended period of time.  I would like you to use it daily for the next 4 weeks for improvement.  You may need to use it for an additional 2 months if it is not completely resolved.  If after 3 months we are not seeing enough improvement we may need to transition to an oral medication.  Please follow up in 3 months for foot fungus  If you haven't already, sign up for My Chart to have easy access to your labs results, and communication with your primary care physician.  Call the clinic at [phone] if your symptoms worsen or you have any concerns.  Please be sure to schedule follow up at the front desk before you leave today.   Jonne Netters, DO Family Medicine

## 2024-03-05 NOTE — Assessment & Plan Note (Signed)
 Doing well on Rosuvastatin  10mg  daily, refill today. -Due for lipid panel at f/u

## 2024-03-05 NOTE — Assessment & Plan Note (Signed)
 125/88, controlled on current regimen. Refill Olmesartan -hydrochlorothiazide 20-12.5mg  daily

## 2024-03-05 NOTE — Assessment & Plan Note (Signed)
 Stable on Amitriptyline  10mg  nightly, refill today.

## 2024-05-05 ENCOUNTER — Ambulatory Visit: Admitting: Student

## 2024-05-05 ENCOUNTER — Encounter: Payer: Self-pay | Admitting: Student

## 2024-05-05 ENCOUNTER — Ambulatory Visit
Admission: RE | Admit: 2024-05-05 | Discharge: 2024-05-05 | Disposition: A | Discharge status: Home / Self Care | Source: Ambulatory Visit | Attending: Family Medicine | Admitting: Family Medicine

## 2024-05-05 VITALS — BP 127/92 | HR 99 | Ht 69.0 in | Wt 186.0 lb

## 2024-05-05 DIAGNOSIS — R052 Subacute cough: Secondary | ICD-10-CM

## 2024-05-05 DIAGNOSIS — R918 Other nonspecific abnormal finding of lung field: Secondary | ICD-10-CM | POA: Diagnosis not present

## 2024-05-05 NOTE — Assessment & Plan Note (Addendum)
 Patient comes in with concern of cough has been a issue for the last month.  Patient reports he traveled to the [place] last month, where he went to [place] and that his family and him both got sick, with the same symptoms.  Family has since resolved, his symptoms seem to wax and wane.  Patient also appreciates subjective fevers, and malaise, patient denies any wheezing, but does feel short of breath while coughing at times.  Patient does not smoke, also reports no recent history of reflux although at this does happen depending on what food he eats.  Suspect patient may have had postviral cough, or may have persistent infection of lungs.  Will obtain chest x-ray.  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  Will consider if coughing persists and chest x-ray negative. - Chest x-ray - Consider D-dimer - Close follow-up

## 2024-05-05 NOTE — Patient Instructions (Signed)
 It was great to see you! Thank you for allowing me to participate in your care!  I recommend that you always bring your medications to each appointment as this makes it easy to ensure we are on the correct medications and helps us  not miss when refills are needed.  Our plans for today:  - Cough I am not sure what is causing your cough, so we will get a chest x-ray  Go to [facility]   Address: [address] [place]   Phone: [phone]  I recommend you follow up later this week if x-ray is negative and cough persist.   Take care and seek immediate care sooner if you develop any concerns.   Dr. Penne Rhein, MD [facility]

## 2024-05-05 NOTE — Progress Notes (Cosign Needed)
  SUBJECTIVE:   CHIEF COMPLAINT / HPI:   Cough He has a persistent cough that began after returning from a month-long trip to the [place], initially experiencing cold-like symptoms. The cough is primarily nocturnal, worsening after sunset, and is sometimes accompanied by weakness and low-grade fevers. It is occasionally productive with mucus and sometimes dry.  He experiences shortness of breath, particularly when exposed to air blowing on his face, such as from a fan or while in a car, which can lead to coughing fits. No smoking or e-cigarette use and no known allergies.  He has a history of bronchitis and describes his current symptoms as similar to past episodes. He occasionally experiences acid reflux, particularly after consuming certain foods, but this is not frequent.  He denies any significant leg swelling or pain, except for minor swelling after long flights, such as the recent 13-hour flight from the [place]. He is a Naval architect, which involves prolonged sitting, but he does not report any new or unusual symptoms related to this.  His family members who traveled with him also experienced similar symptoms, but they have since recovered. His symptoms have been intermittent, with periods of improvement followed by recurrence, most recently worsening over the past week.  PERTINENT  PMH / PSH:   OBJECTIVE:  BP (!) 127/92   Pulse 99   Ht 5' 9 (1.753 m)   Wt 186 lb (84.4 kg)   SpO2 98%   BMI 27.47 kg/m  Physical Exam Constitutional:      General: He is not in acute distress.    Appearance: Normal appearance. He is not ill-appearing.  Cardiovascular:     Rate and Rhythm: Normal rate and regular rhythm.     Heart sounds: No murmur heard.    No friction rub. No gallop.  Pulmonary:     Effort: Pulmonary effort is normal. No respiratory distress.     Breath sounds: Normal breath sounds. No stridor. No wheezing, rhonchi or rales.  Skin:    Capillary Refill: Capillary  refill takes less than 2 seconds.  Neurological:     Mental Status: He is alert.  Psychiatric:        Mood and Affect: Mood normal.        Behavior: Behavior normal.      ASSESSMENT/PLAN:   Assessment & Plan Subacute cough Patient comes in with concern of cough has been a issue for the last month.  Patient reports he traveled to the [place] last month, where he went to [place] and that his family and him both got sick, with the same symptoms.  Family has since resolved, his symptoms seem to wax and wane.  Patient also appreciates subjective fevers, and malaise, patient denies any wheezing, but does feel short of breath while coughing at times.  Patient does not smoke, also reports no recent history of reflux although at this does happen depending on what food he eats.  Suspect patient may have had postviral cough, or may have persistent infection of lungs.  Will obtain chest x-ray.  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  Will consider if coughing persists and chest x-ray negative. - Chest x-ray - Consider D-dimer - Close follow-up No follow-ups on file. Penne Rhein, MD 05/05/2024, 10:31 AM PGY-3, [facility]

## 2024-05-09 ENCOUNTER — Telehealth: Payer: Self-pay | Admitting: Student

## 2024-05-09 ENCOUNTER — Ambulatory Visit: Payer: Self-pay | Admitting: Family Medicine

## 2024-05-09 ENCOUNTER — Telehealth: Payer: Self-pay

## 2024-05-09 MED ORDER — AZITHROMYCIN 250 MG PO TABS: 250.0000 mg | ORAL_TABLET | Freq: Every day | ORAL | 0 refills | Status: AC

## 2024-05-09 MED ORDER — AMOXICILLIN-POT CLAVULANATE 875-125 MG PO TABS: 1.0000 | ORAL_TABLET | Freq: Three times a day (TID) | ORAL | 0 refills | Status: AC

## 2024-05-09 NOTE — Telephone Encounter (Signed)
 Pt called and results discussed. Plan to treat for PNA with Augmentin and Azithromycin, patient feeling slightly better but continues to have cough and feel ill

## 2024-05-09 NOTE — Telephone Encounter (Signed)
 Pt called and results discussed.

## 2024-05-09 NOTE — Telephone Encounter (Signed)
Patient calls nurse line requesting recent imaging results.   Will forward to provider who saw patient.

## 2024-10-25 ENCOUNTER — Other Ambulatory Visit: Payer: Self-pay | Admitting: Student

## 2024-10-25 ENCOUNTER — Other Ambulatory Visit: Payer: Self-pay | Admitting: Family Medicine

## 2024-10-25 DIAGNOSIS — G479 Sleep disorder, unspecified: Secondary | ICD-10-CM

## 2024-10-25 DIAGNOSIS — I10 Essential (primary) hypertension: Secondary | ICD-10-CM

## 2024-10-31 ENCOUNTER — Encounter: Payer: Self-pay | Admitting: Family Medicine

## 2024-10-31 ENCOUNTER — Ambulatory Visit: Admitting: Family Medicine

## 2024-10-31 VITALS — BP 126/88 | HR 94 | Ht 69.5 in | Wt 191.8 lb

## 2024-10-31 DIAGNOSIS — B353 Tinea pedis: Secondary | ICD-10-CM | POA: Diagnosis not present

## 2024-10-31 DIAGNOSIS — L810 Postinflammatory hyperpigmentation: Secondary | ICD-10-CM

## 2024-10-31 DIAGNOSIS — L309 Dermatitis, unspecified: Secondary | ICD-10-CM

## 2024-10-31 DIAGNOSIS — I878 Other specified disorders of veins: Secondary | ICD-10-CM | POA: Diagnosis not present

## 2024-10-31 DIAGNOSIS — E7849 Other hyperlipidemia: Secondary | ICD-10-CM | POA: Diagnosis not present

## 2024-10-31 DIAGNOSIS — G479 Sleep disorder, unspecified: Secondary | ICD-10-CM | POA: Diagnosis not present

## 2024-10-31 MED ORDER — AMITRIPTYLINE HCL 50 MG PO TABS: ORAL_TABLET | ORAL | 0 refills | Status: AC

## 2024-10-31 MED ORDER — HYDROCORTISONE 1 % EX OINT: 1.0000 | TOPICAL_OINTMENT | Freq: Two times a day (BID) | CUTANEOUS | 0 refills | Status: AC

## 2024-10-31 MED ORDER — KETOCONAZOLE 2 % EX CREA: TOPICAL_CREAM | Freq: Every day | CUTANEOUS | 2 refills | Status: AC

## 2024-10-31 MED ORDER — ROSUVASTATIN CALCIUM 10 MG PO TABS: 10.0000 mg | ORAL_TABLET | Freq: Every day | ORAL | 1 refills | Status: AC

## 2024-10-31 NOTE — Assessment & Plan Note (Signed)
 Refilled amitriptyline 

## 2024-10-31 NOTE — Progress Notes (Signed)
" ° °  SUBJECTIVE:   CHIEF COMPLAINT / HPI:  Gerald Parker is a 54 y.o. male with a pertinent past medical history of insomnia, BPH, and HTN presenting to the clinic for pruritus/rash and refills.  Itching skin For 1 month, he has been experiencing itching of his neck and has some burning as well. His skin is very dry and cracked. He did have a more scaly rash, shows a photo of this prior. Now, he has noticed primarily darkening of the skin.  Lower extremity swelling Patient reports that he has been standing for close to 11 hours on his feet continuously at work. Over the past few months he has noticed lower extremity swelling after work. Swelling is equal bilaterally.  No associated pain, erythema, or rash.  Insomnia Requests refill of amitriptyline . This is working well for him and he is able to sleep much better on this medication without significant side effects.  *Requests refills of: - Benicar : HTN - Rosuvastatin : HLD - Amitriptyline : Insomnia - Ketoconazole : Tinea pedis  PERTINENT PMH / PSH: *Reviewed in problem list.   OBJECTIVE:   BP 126/88   Pulse 94   Ht 5' 9.5 (1.765 m)   Wt 191 lb 12.8 oz (87 kg)   SpO2 100%   BMI 27.92 kg/m   General: Age-appropriate, resting comfortably in chair, NAD, alert and at baseline. Extremities: Trace peripheral edema bilaterally. Skin: Regions of postinflammatory hyperpigmentation with significantly dry, cracked skin on chin and cheeks as below.       ASSESSMENT/PLAN:   Assessment & Plan Dermatitis Postinflammatory hyperpigmentation Favor eczematous or psoriatic changes.  No concern for infectious etiology.  Rash will respond to steroids.  Postinflammatory skin will gradually lighten.  Dry cracked skin will benefit from emollient. - Hydrocortisone  1% for 1-2 weeks in pruritic areas - Recommended copious Vaseline application frequently - Return if not improving Venous stasis Suspect venous stasis. - Prescription for  compression stockings provided Other hyperlipidemia - Refilled rosuvastatin  Tinea pedis of both feet - Refilled ketoconazole  Difficulty sleeping - Refilled amitriptyline   Return if not improving  Debara Kamphuis Toma, MD [facility] "

## 2024-10-31 NOTE — Assessment & Plan Note (Signed)
 Refilled rosuvastatin 

## 2024-10-31 NOTE — Patient Instructions (Signed)
 It was great to see you today! Thank you for choosing [facility] for your primary care.  Today we addressed:  I have refilled your amitriptyline , rosuvastatin , and ketoconazole .  Skin irritation and darkening For your neck and arms, please use the hydrocortisone  1% steroid ointment for areas that are itching where the skin is darker.  Please do not use this for more than 2 weeks at a time as this will thin out your skin and communicatively.  After you use the hydrocortisone  steroid, please apply a thick layer of Vaseline.  You should always have some Vaseline on your skin as this protects you from getting dried out and cracking.  It has darkened because it has been irritated and inflamed, this will improve over time.  Knee and leg swelling I have provided you a prescription for compression stockings, you can pick these up from the medical supply store and wear them while you are working.  You should return to our clinic to address your further concerns within 1 week.  Thank you for coming to see us  at [facility] and for the opportunity to care for you! Chisom Aust, MD 10/31/2024, 4:35 PM

## 2024-11-03 ENCOUNTER — Ambulatory Visit: Payer: Self-pay | Admitting: Family Medicine
